# Patient Record
Sex: Female | Born: 1985 | Race: White | Hispanic: No | Marital: Single | Smoking: Never smoker
Health system: Southern US, Community
[De-identification: ages and names within clinical notes are randomized; demographics above are authoritative.]

## PROBLEM LIST (undated history)

## (undated) DIAGNOSIS — G113 Cerebellar ataxia with defective DNA repair: Secondary | ICD-10-CM

## (undated) DIAGNOSIS — K529 Noninfective gastroenteritis and colitis, unspecified: Secondary | ICD-10-CM

## (undated) DIAGNOSIS — R87619 Unspecified abnormal cytological findings in specimens from cervix uteri: Secondary | ICD-10-CM

## (undated) DIAGNOSIS — I82409 Acute embolism and thrombosis of unspecified deep veins of unspecified lower extremity: Secondary | ICD-10-CM

## (undated) DIAGNOSIS — L309 Dermatitis, unspecified: Secondary | ICD-10-CM

## (undated) DIAGNOSIS — Z87442 Personal history of urinary calculi: Secondary | ICD-10-CM

## (undated) DIAGNOSIS — Z148 Genetic carrier of other disease: Secondary | ICD-10-CM

## (undated) DIAGNOSIS — F32A Depression, unspecified: Secondary | ICD-10-CM

## (undated) DIAGNOSIS — F419 Anxiety disorder, unspecified: Secondary | ICD-10-CM

## (undated) HISTORY — DX: Genetic carrier of other disease: Z14.8

## (undated) HISTORY — DX: Unspecified abnormal cytological findings in specimens from cervix uteri: R87.619

## (undated) HISTORY — DX: Depression, unspecified: F32.A

## (undated) HISTORY — DX: Cerebellar ataxia with defective DNA repair: G11.3

## (undated) HISTORY — DX: Personal history of urinary calculi: Z87.442

## (undated) HISTORY — DX: Noninfective gastroenteritis and colitis, unspecified: K52.9

## (undated) HISTORY — PX: WISDOM TOOTH EXTRACTION: SHX21

## (undated) HISTORY — DX: Dermatitis, unspecified: L30.9

## (undated) HISTORY — DX: Acute embolism and thrombosis of unspecified deep veins of unspecified lower extremity: I82.409

## (undated) HISTORY — DX: Anxiety disorder, unspecified: F41.9

---

## 2010-07-18 HISTORY — PX: AUGMENTATION MAMMAPLASTY: SUR837

## 2010-07-18 HISTORY — PX: PLACEMENT OF BREAST IMPLANTS: SHX6334

## 2012-12-18 HISTORY — PX: CERVICAL BIOPSY  W/ LOOP ELECTRODE EXCISION: SUR135

## 2014-01-06 ENCOUNTER — Ambulatory Visit: Payer: Self-pay | Admitting: General Practice

## 2014-03-10 ENCOUNTER — Ambulatory Visit: Payer: Self-pay | Admitting: Urology

## 2014-03-19 ENCOUNTER — Emergency Department: Payer: Self-pay | Admitting: Emergency Medicine

## 2014-03-19 LAB — URINALYSIS, COMPLETE
BILIRUBIN, UR: NEGATIVE
BLOOD: NEGATIVE
Glucose,UR: NEGATIVE mg/dL (ref 0–75)
KETONE: NEGATIVE
Leukocyte Esterase: NEGATIVE
Nitrite: NEGATIVE
PROTEIN: NEGATIVE
Ph: 6 (ref 4.5–8.0)
Specific Gravity: 1.025 (ref 1.003–1.030)
WBC UR: <1 /HPF (ref 0–5)

## 2014-03-19 LAB — COMPREHENSIVE METABOLIC PANEL
ANION GAP: 9 (ref 7–16)
Albumin: 4 g/dL (ref 3.4–5.0)
Alkaline Phosphatase: 87 U/L
BILIRUBIN TOTAL: 0.8 mg/dL (ref 0.2–1.0)
BUN: 21 mg/dL — ABNORMAL HIGH (ref 7–18)
CALCIUM: 8.7 mg/dL (ref 8.5–10.1)
CO2: 24 mmol/L (ref 21–32)
Chloride: 101 mmol/L (ref 98–107)
Creatinine: 1.37 mg/dL — ABNORMAL HIGH (ref 0.60–1.30)
GFR CALC NON AF AMER: 53 — AB
GLUCOSE: 85 mg/dL (ref 65–99)
Osmolality: 270 (ref 275–301)
Potassium: 3.4 mmol/L — ABNORMAL LOW (ref 3.5–5.1)
SGOT(AST): 23 U/L (ref 15–37)
SGPT (ALT): 29 U/L (ref 12–78)
SODIUM: 134 mmol/L — AB (ref 136–145)
Total Protein: 8 g/dL (ref 6.4–8.2)

## 2014-03-19 LAB — CBC WITH DIFFERENTIAL/PLATELET
BASOS ABS: 0.1 10*3/uL (ref 0.0–0.1)
BASOS PCT: 0.5 %
EOS ABS: 0.1 10*3/uL (ref 0.0–0.7)
Eosinophil %: 0.5 %
HCT: 38.6 % (ref 35.0–47.0)
HGB: 13.1 g/dL (ref 12.0–16.0)
LYMPHS ABS: 2 10*3/uL (ref 1.0–3.6)
Lymphocyte %: 13.3 %
MCH: 30.3 pg (ref 26.0–34.0)
MCHC: 33.8 g/dL (ref 32.0–36.0)
MCV: 90 fL (ref 80–100)
Monocyte #: 1.1 x10 3/mm — ABNORMAL HIGH (ref 0.2–0.9)
Monocyte %: 7.8 %
Neutrophil #: 11.4 10*3/uL — ABNORMAL HIGH (ref 1.4–6.5)
Neutrophil %: 77.9 %
Platelet: 274 10*3/uL (ref 150–440)
RBC: 4.31 10*6/uL (ref 3.80–5.20)
RDW: 12.5 % (ref 11.5–14.5)
WBC: 14.7 10*3/uL — ABNORMAL HIGH (ref 3.6–11.0)

## 2014-03-22 ENCOUNTER — Emergency Department: Payer: Self-pay | Admitting: Emergency Medicine

## 2014-03-22 LAB — URINALYSIS, COMPLETE
BILIRUBIN, UR: NEGATIVE
Glucose,UR: NEGATIVE mg/dL (ref 0–75)
KETONE: NEGATIVE
Leukocyte Esterase: NEGATIVE
Nitrite: NEGATIVE
PH: 6 (ref 4.5–8.0)
Protein: NEGATIVE
RBC,UR: 26 /HPF (ref 0–5)
Specific Gravity: 1.028 (ref 1.003–1.030)

## 2014-03-22 LAB — CBC WITH DIFFERENTIAL/PLATELET
BASOS ABS: 0.1 10*3/uL (ref 0.0–0.1)
Basophil %: 0.5 %
EOS PCT: 0.7 %
Eosinophil #: 0.1 10*3/uL (ref 0.0–0.7)
HCT: 38.8 % (ref 35.0–47.0)
HGB: 13.3 g/dL (ref 12.0–16.0)
LYMPHS ABS: 1.6 10*3/uL (ref 1.0–3.6)
Lymphocyte %: 13.9 %
MCH: 30.7 pg (ref 26.0–34.0)
MCHC: 34.1 g/dL (ref 32.0–36.0)
MCV: 90 fL (ref 80–100)
Monocyte #: 0.9 x10 3/mm (ref 0.2–0.9)
Monocyte %: 7.6 %
NEUTROS PCT: 77.3 %
Neutrophil #: 9 10*3/uL — ABNORMAL HIGH (ref 1.4–6.5)
Platelet: 296 10*3/uL (ref 150–440)
RBC: 4.31 10*6/uL (ref 3.80–5.20)
RDW: 12.7 % (ref 11.5–14.5)
WBC: 11.6 10*3/uL — ABNORMAL HIGH (ref 3.6–11.0)

## 2014-03-22 LAB — BASIC METABOLIC PANEL
Anion Gap: 5 — ABNORMAL LOW (ref 7–16)
BUN: 17 mg/dL (ref 7–18)
CHLORIDE: 105 mmol/L (ref 98–107)
CREATININE: 1.12 mg/dL (ref 0.60–1.30)
Calcium, Total: 8.7 mg/dL (ref 8.5–10.1)
Co2: 29 mmol/L (ref 21–32)
EGFR (African American): >60
Glucose: 93 mg/dL (ref 65–99)
OSMOLALITY: 279 (ref 275–301)
Potassium: 3.4 mmol/L — ABNORMAL LOW (ref 3.5–5.1)
SODIUM: 139 mmol/L (ref 136–145)

## 2014-03-25 ENCOUNTER — Ambulatory Visit: Payer: Self-pay | Admitting: General Practice

## 2014-03-31 ENCOUNTER — Ambulatory Visit: Payer: Self-pay | Admitting: Urology

## 2014-03-31 LAB — PREGNANCY, URINE: Pregnancy Test, Urine: NEGATIVE m[IU]/mL

## 2014-04-02 ENCOUNTER — Ambulatory Visit: Payer: Self-pay | Admitting: Urology

## 2015-01-01 IMAGING — CT CT STONE STUDY
1 of 4 series · 3 of 46 positions shown, 7 images · non-contrast
Comparison: CT of the abdomen and pelvis performed 01/06/2014

CLINICAL DATA: Left flank pain.

EXAM:
CT ABDOMEN AND PELVIS WITHOUT CONTRAST
TECHNIQUE: Multidetector CT imaging of the abdomen and pelvis was performed
following the standard protocol without IV contrast.

[Series 4: lung · axial · 0.67mm/px · z∈[-124,-89]mm · 3 of 8 slices shown, 7 images]
[im 1/8  soft-tissue]
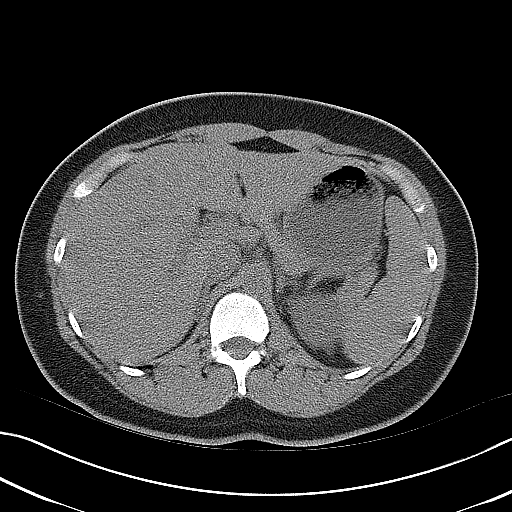
[im 1/8  lung]
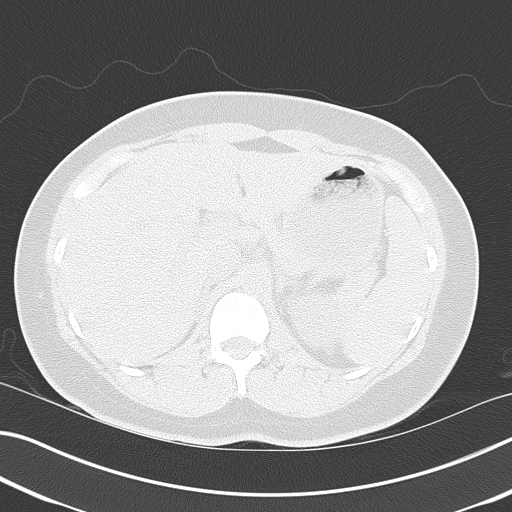
[im 1/8  bone]
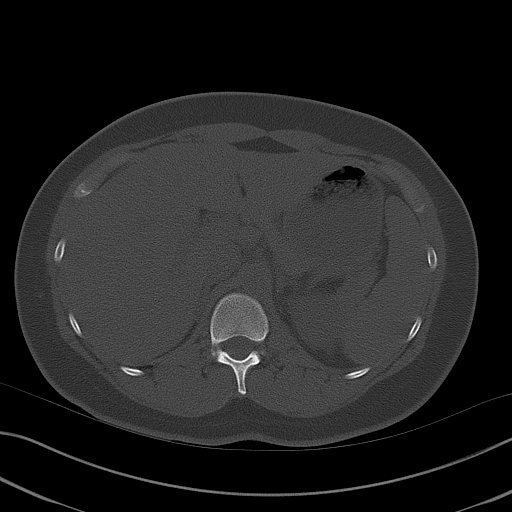
[im 4/8  soft-tissue]
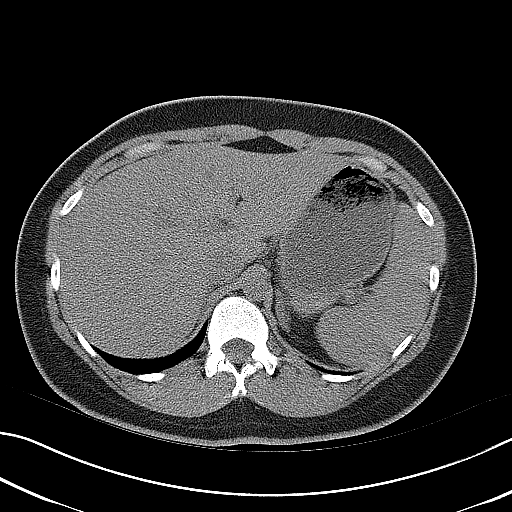
[im 4/8  lung]
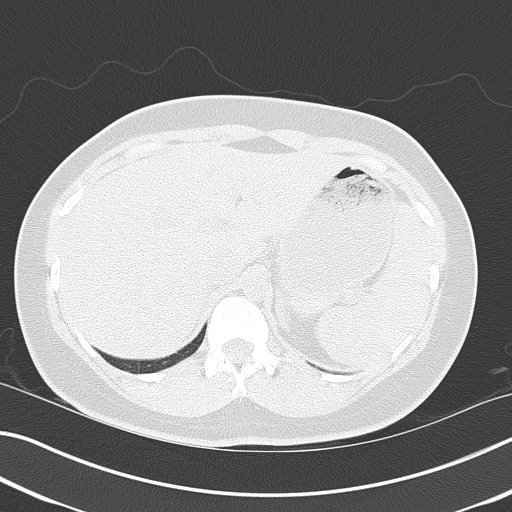
[im 8/8  soft-tissue]
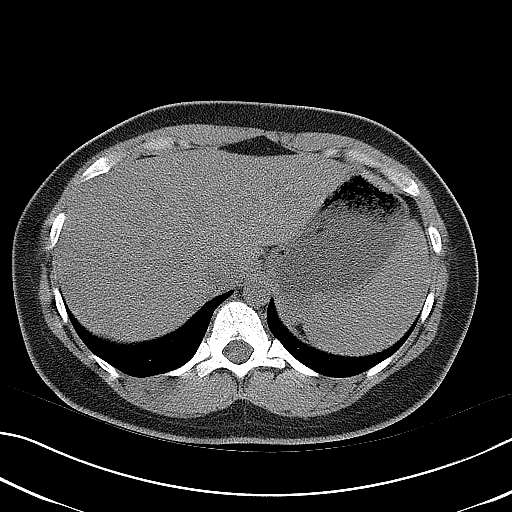
[im 8/8  lung]
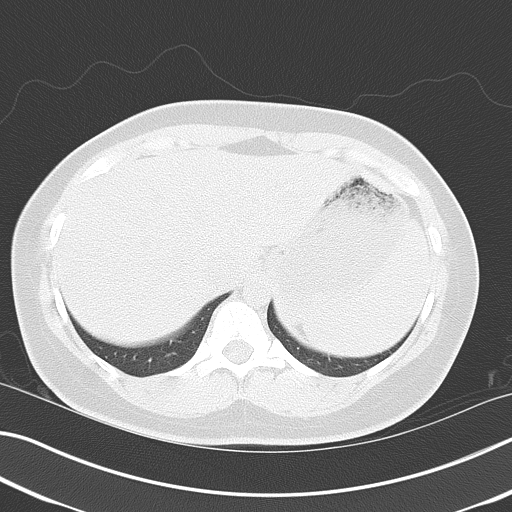

[3 of 46 positions shown; findings below may reference images not displayed]

FINDINGS: The minimally visualized lung bases are clear.

The visualized portions of the liver and spleen are unremarkable in
appearance. The gallbladder is within normal limits. The pancreas
and adrenal glands are unremarkable.

There is mild left-sided hydronephrosis, with left-sided perinephric
stranding and fluid, and an obstructing 4 mm stone seen distally at
the left vesicoureteral junction, along the base of the bladder.
Small bilateral nonobstructing renal stones are seen, measuring up
to 5 mm in size. The right kidney is otherwise unremarkable in
appearance.

No free fluid is identified. The small bowel is unremarkable in
appearance. The stomach is within normal limits. No acute vascular
abnormalities are seen.

The appendix is normal in caliber and contains air, without evidence
for appendicitis. The colon is unremarkable in appearance.

The bladder is largely decompressed and not well assessed. The
uterus is grossly unremarkable, with an intrauterine device noted in
expected position at the fundus of the uterus. The ovaries are
relatively symmetric; no suspicious adnexal masses are seen. No
inguinal lymphadenopathy is seen.

No acute osseous abnormalities are identified.
IMPRESSION: 1. Mild left-sided hydronephrosis, with an obstructing 4 mm stone
seen distally at the left vesicoureteral junction, along the base of
the bladder.
2. Small bilateral nonobstructing renal stones seen, measuring up to
5 mm in size.

## 2015-01-15 IMAGING — CR DG ABDOMEN 1V
1 series · 2 of 2 positions shown · non-contrast
Comparison: 03/25/2014, 03/19/2014

CLINICAL DATA: Left nephrolithiasis

EXAM:
ABDOMEN - 1 VIEW

[Series 1: ap · 0.17mm/px · 2 of 2 slices shown]
[im 1/2]
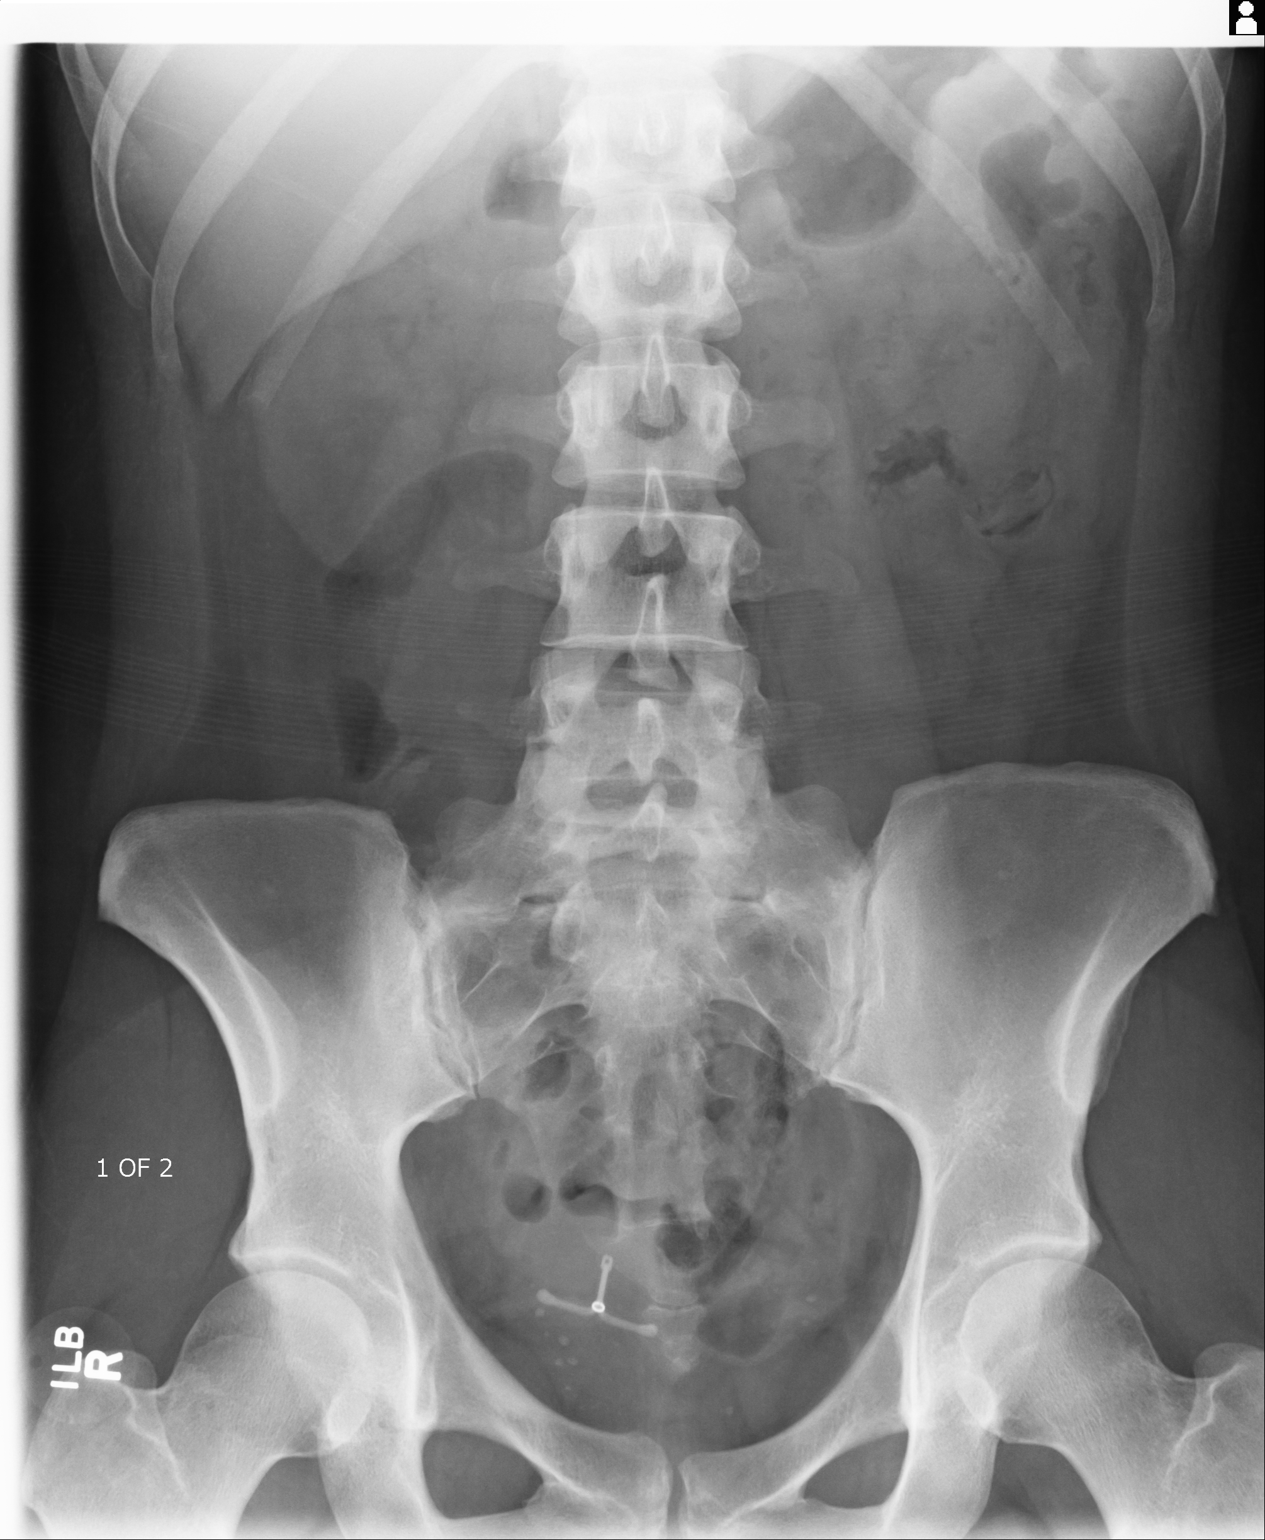
[im 2/2]
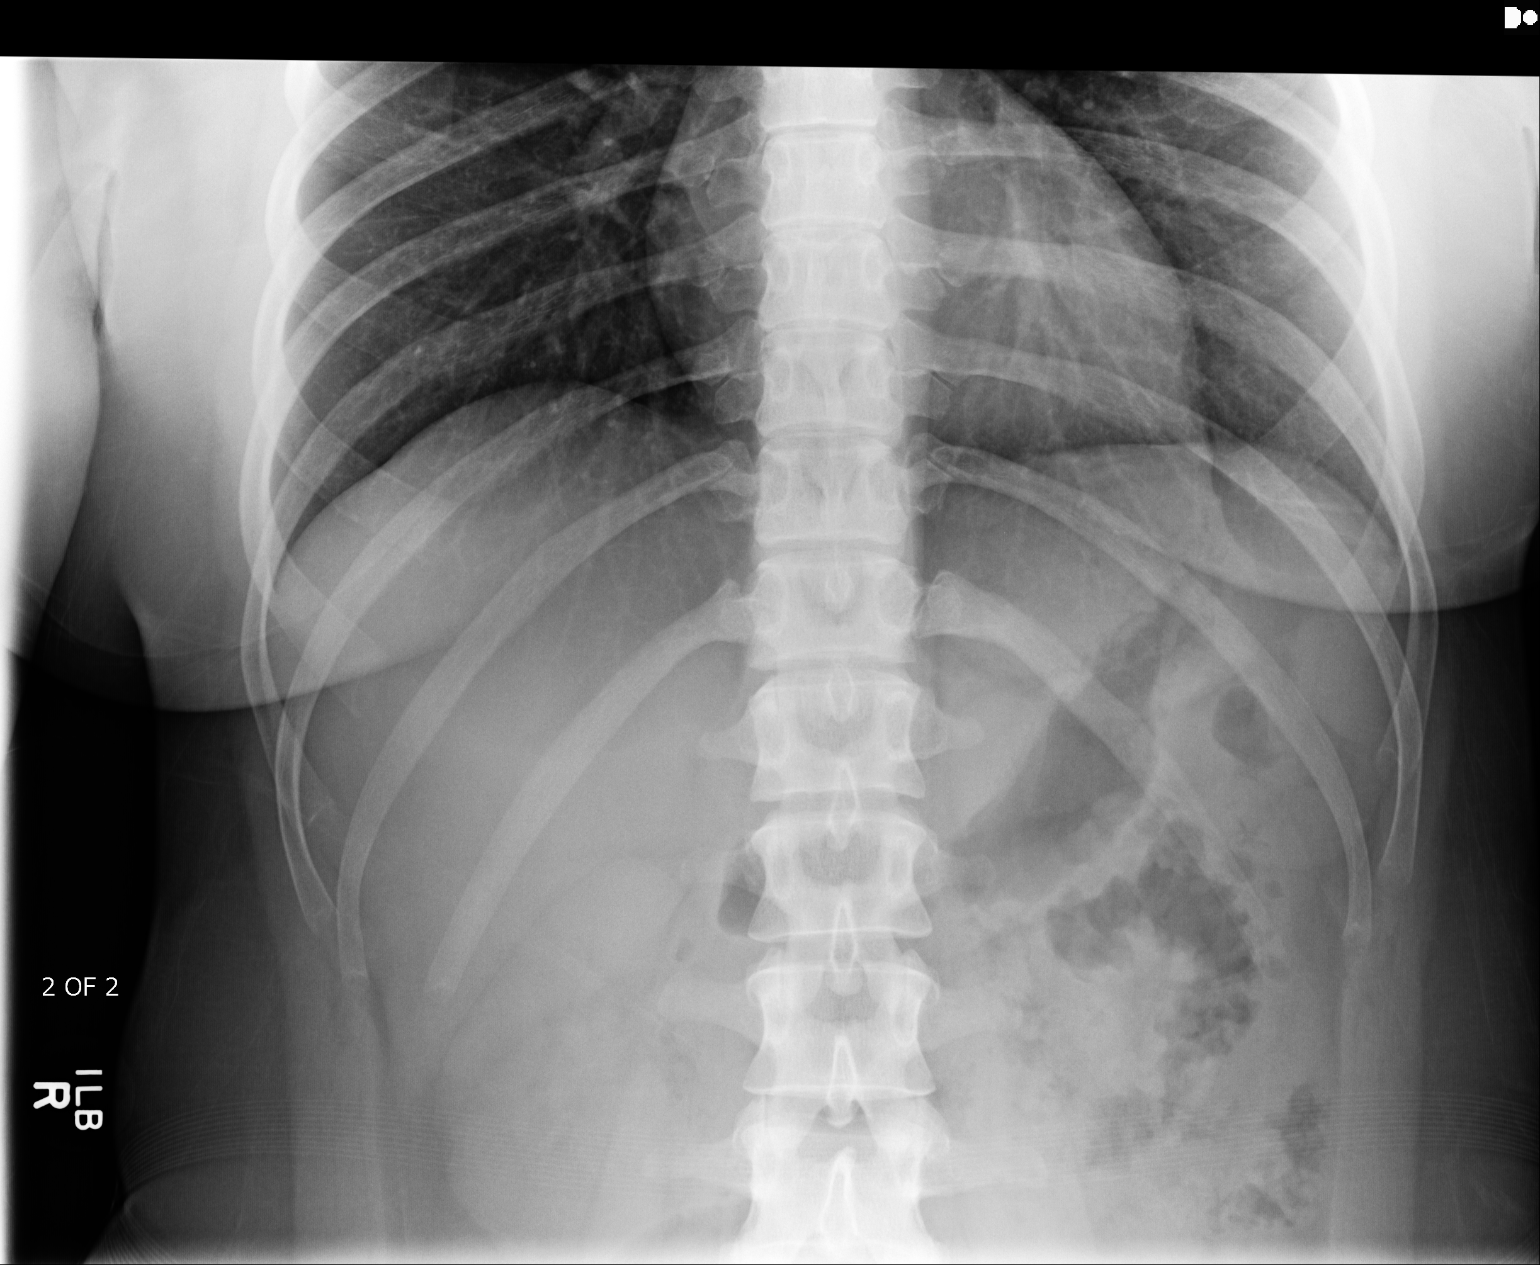

[2 of 2 positions shown; findings below may reference images not displayed]

FINDINGS: Stable punctate tiny pelvic calcifications. IUD noted within the
right central pelvis. Small punctate calcifications in the left
kidney lower pole as before. Normal bowel gas pattern without
obstruction. No osseous abnormality. Stable exam.
IMPRESSION: Stable left nephrolithiasis. Stable pelvic calcifications

## 2015-03-14 ENCOUNTER — Emergency Department: Payer: Self-pay | Admitting: Emergency Medicine

## 2015-03-14 LAB — URINALYSIS, COMPLETE
Bilirubin,UR: NEGATIVE
GLUCOSE, UR: NEGATIVE mg/dL (ref 0–75)
Ketone: NEGATIVE
LEUKOCYTE ESTERASE: NEGATIVE
NITRITE: NEGATIVE
PH: 5 (ref 4.5–8.0)
SPECIFIC GRAVITY: 1.026 (ref 1.003–1.030)
Squamous Epithelial: 1
WBC UR: 3 /HPF (ref 0–5)

## 2015-03-14 LAB — CBC
HCT: 40.4 % (ref 35.0–47.0)
HGB: 13.6 g/dL (ref 12.0–16.0)
MCH: 30.5 pg (ref 26.0–34.0)
MCHC: 33.6 g/dL (ref 32.0–36.0)
MCV: 91 fL (ref 80–100)
PLATELETS: 278 10*3/uL (ref 150–440)
RBC: 4.46 10*6/uL (ref 3.80–5.20)
RDW: 12.5 % (ref 11.5–14.5)
WBC: 9.6 10*3/uL (ref 3.6–11.0)

## 2015-03-14 LAB — COMPREHENSIVE METABOLIC PANEL
ALBUMIN: 4.2 g/dL
AST: 61 U/L — AB
Alkaline Phosphatase: 83 U/L
Anion Gap: 7 (ref 7–16)
BILIRUBIN TOTAL: 0.5 mg/dL
BUN: 16 mg/dL
CHLORIDE: 101 mmol/L
CREATININE: 0.91 mg/dL
Calcium, Total: 8.7 mg/dL — ABNORMAL LOW
Co2: 25 mmol/L
EGFR (Non-African Amer.): >60
GLUCOSE: 96 mg/dL
Potassium: 4.2 mmol/L
SGPT (ALT): 63 U/L — ABNORMAL HIGH
SODIUM: 133 mmol/L — AB
TOTAL PROTEIN: 7.5 g/dL

## 2021-03-24 ENCOUNTER — Telehealth: Payer: Self-pay | Admitting: Hematology and Oncology

## 2021-03-24 NOTE — Telephone Encounter (Signed)
Received a new referral from Dr. Gaetano Net for ATM gene mutation.  [name] returned my call and has been scheduled to see Dr. Chryl Heck on 4/14 at 10am. Pt aware to arrive 20 minutes early.

## 2021-03-28 ENCOUNTER — Encounter: Payer: Self-pay | Admitting: Gastroenterology

## 2021-03-31 ENCOUNTER — Encounter: Payer: Self-pay | Admitting: Hematology and Oncology

## 2021-03-31 ENCOUNTER — Other Ambulatory Visit: Payer: Self-pay

## 2021-03-31 ENCOUNTER — Inpatient Hospital Stay: Attending: Hematology and Oncology | Admitting: Hematology and Oncology

## 2021-03-31 DIAGNOSIS — Z1589 Genetic susceptibility to other disease: Secondary | ICD-10-CM | POA: Diagnosis not present

## 2021-03-31 DIAGNOSIS — Z1502 Genetic susceptibility to malignant neoplasm of ovary: Secondary | ICD-10-CM | POA: Insufficient documentation

## 2021-03-31 DIAGNOSIS — Z1509 Genetic susceptibility to other malignant neoplasm: Secondary | ICD-10-CM | POA: Diagnosis present

## 2021-03-31 DIAGNOSIS — Z1501 Genetic susceptibility to malignant neoplasm of breast: Secondary | ICD-10-CM

## 2021-03-31 NOTE — Assessment & Plan Note (Signed)
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  It is estimated that approximately 3 percent of White people in the [place] are ATM heterozygotes I discussed with the patient that having ATM gene mutation leads to 2-3 times increased risk of breast cancer which translates into a 30% lifetime risk of breast cancer. In two large cohort studies, it was thought that ER positive breast cancer is common than ER negative breast cancer among ATM carriers.  Intervention  For those with pathogenic variants in ATM, we typically initiate annual mammography with tomography and annual MRI, starting at age 71 years, given evidence of moderately increased lifetime risk of breast cancer.  The evidence is insufficient to uniformly recommend RRM, although for those with a concerning family history (>20 percent risk of breast cancer by a model), it may be reasonable for carriers to consider this option  Pathogenic variants in ATM do not appear to confer a significantly increased risk for ovarian cancer. For carriers who have a family history of ovarian cancer, we discuss the potential risks and benefits of rrBSO.  For ATM carriers with a family history of pancreatic cancer, pancreatic cancer screening is offered with annual MRCP or EUS.  PLAN Mammogram and MRI ordered by his gynecologist. SBE recommended monthly Discussed about life style modification including regular exercise, limiting alcohol intake and avoiding HRT in the future. She will RTC in 6 months She will send [place] a copy of the MRI report.

## 2021-03-31 NOTE — Progress Notes (Signed)
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  It is estimated that approximately 3 percent of White people in the [place] are ATM heterozygotes I discussed with the patient that having ATM gene mutation leads to 2-3 times increased risk of breast cancer which translates into a 30% lifetime risk of breast cancer. In two large cohort studies, it was thought that ER positive breast cancer is common than ER negative breast cancer among ATM carriers.  Intervention  For those with pathogenic variants in ATM, we typically initiate annual mammography with tomography and annual MRI, starting at age 39 years, given evidence of moderately increased lifetime risk of breast cancer.  The evidence is insufficient to uniformly recommend RRM, although for those with a concerning family history (>20 percent risk of breast cancer by a model), it may be reasonable for carriers to consider this option  Pathogenic variants in ATM do not appear to confer a significantly increased risk for ovarian cancer. For carriers who have a family history of ovarian cancer, we discuss the potential risks and benefits of rrBSO.  For ATM carriers with a family history of pancreatic cancer, pancreatic cancer screening is offered with annual MRCP or EUS.  PLAN Mammogram and MRI ordered by his gynecologist. SBE recommended monthly Discussed about life style modification including regular exercise, limiting alcohol intake and avoiding HRT in the future. She will RTC in 6 months She will send [place] a copy of the MRI report.  No orders of the  defined types were placed in this encounter.   HISTORY OF PRESENTING ILLNESS:   Amy Landry 35 y.o. female is here because of ATM mutation. [name] is here with her mother for initial visit. She is a healthy 35 yr old with no significant PMH. She has bilateral saline implants. She had genetic testing because her brother's daughter had ataxia telangiectasia and died of leukemia. Rest of the pertinent 10 point ROS reviewed and negative.  REVIEW OF SYSTEMS:   Constitutional: Denies fevers, chills or abnormal night sweats Eyes: Denies blurriness of vision, double vision or watery eyes Ears, nose, mouth, throat, and face: Denies mucositis or sore throat Respiratory: Denies cough, dyspnea or wheezes Cardiovascular: Denies palpitation, chest discomfort or lower extremity swelling Gastrointestinal:  Denies nausea, heartburn or change in bowel habits Skin: Denies abnormal skin rashes Lymphatics: Denies new lymphadenopathy or easy bruising Neurological:Denies numbness, tingling or new weaknesses Behavioral/Psych: Mood is stable, no new changes  All other systems were reviewed with the patient and are negative.  MEDICAL HISTORY:   No past medical history on file.  SURGICAL HISTORY:  Bilateral breast augmentation  SOCIAL HISTORY: Social History   Socioeconomic History  . Marital status: Divorced    Spouse name: Not on file  . Number of children: Not on file  . Years of education: Not on file  . Highest education level: Not on file  Occupational History  . Not on file  Tobacco Use  . Smoking status: Not on file  . Smokeless tobacco: Not on file  Substance and Sexual Activity  . Alcohol use: Not on file  . Drug use: Not on file  .  Sexual activity: Not on file  Other Topics Concern  . Not on file  Social History Narrative  . Not on file   Social Determinants of Health   Financial Resource Strain: Not on file  Food Insecurity: Not on file  Transportation Needs: Not on file   Physical Activity: Not on file  Stress: Not on file  Social Connections: Not on file  Intimate Partner Violence: Not on file    FAMILY HISTORY: No family history on file.  ALLERGIES:  is allergic to miconazole and sulfa antibiotics.  MEDICATIONS:  Current Outpatient Medications  Medication Sig Dispense Refill  . fluocinonide-emollient (LIDEX-E) 0.05 % cream     . Levonorgestrel (SKYLA) 13.5 MG IUD Skyla 14 mcg/24 hrs (3 yrs) 13.5 mg intrauterine device  Take 1 device by intrauterine route.    . norethindrone-ethinyl estradiol (JUNEL FE 1/20) 1-20 MG-MCG tablet     . traZODone (DESYREL) 50 MG tablet     . valACYclovir (VALTREX) 500 MG tablet     . fluticasone (FLONASE) 50 MCG/ACT nasal spray 1 spray by Both Nostrils route daily.    Marland Kitchen levocetirizine (XYZAL) 5 MG tablet Take by mouth.    . spironolactone (ALDACTONE) 50 MG tablet Take 50 mg by mouth 2 (two) times daily.    Marland Kitchen tretinoin (RETIN-A) 0.1 % cream Apply 1 application topically at bedtime.     No current facility-administered medications for this visit.   PHYSICAL EXAMINATION: ECOG PERFORMANCE STATUS: 0 - Asymptomatic  Vitals:   03/31/21 1009  BP: (!) 128/95  Pulse: 87  Resp: 16  Temp: 97.9 F (36.6 C)  SpO2: 99%   Filed Weights   03/31/21 1009  Weight: 161 lb 8 oz (73.3 kg)    GENERAL:alert, no distress and comfortable SKIN: skin color, texture, turgor are normal, no rashes or significant lesions EYES: normal, conjunctiva are pink and non-injected, sclera clear OROPHARYNX:no exudate, no erythema and lips, buccal mucosa, and tongue normal  NECK: supple, thyroid normal size, non-tender, without nodularity LYMPH:  no palpable lymphadenopathy in the cervical, axillary or inguinal LUNGS: clear to auscultation and percussion with normal breathing effort HEART: regular rate & rhythm and no murmurs and no lower extremity edema ABDOMEN:abdomen soft, non-tender and normal bowel sounds Musculoskeletal:no cyanosis of  digits and no clubbing  PSYCH: alert & oriented x 3 with fluent speech NEURO: no focal motor/sensory deficits Breast: Bilateral breasts examined. NO concerns for palpable lymphadenopathy.  LABORATORY DATA:  I have reviewed the data as listed Lab Results  Component Value Date   WBC 9.6 03/14/2015   HGB 13.6 03/14/2015   HCT 40.4 03/14/2015   MCV 91 03/14/2015   PLT 278 03/14/2015     Chemistry      Component Value Date/Time   NA 133 (L) 03/14/2015 1127   K 4.2 03/14/2015 1127   CL 101 03/14/2015 1127   CO2 25 03/14/2015 1127   BUN 16 03/14/2015 1127   CREATININE 0.91 03/14/2015 1127      Component Value Date/Time   CALCIUM 8.7 (L) 03/14/2015 1127   ALKPHOS 83 03/14/2015 1127   AST 61 (H) 03/14/2015 1127   ALT 63 (H) 03/14/2015 1127   BILITOT 0.5 03/14/2015 1127      RADIOGRAPHIC STUDIES: I have personally reviewed the radiological images as listed and agreed with the findings in the report. No results found.  All questions were answered. The patient knows to call the clinic with any problems, questions or concerns. I spent 45 minutes  in the care of this patient including H and P, review of records, counseling and coordination of care.     Benay Pike, MD 03/31/2021 12:48 PM

## 2021-04-01 ENCOUNTER — Telehealth: Payer: Self-pay | Admitting: Hematology and Oncology

## 2021-04-01 NOTE — Telephone Encounter (Signed)
Scheduled per 04/14 los, patient has been called and voicemail was left.

## 2021-05-10 ENCOUNTER — Ambulatory Visit (INDEPENDENT_AMBULATORY_CARE_PROVIDER_SITE_OTHER): Admitting: Gastroenterology

## 2021-05-10 ENCOUNTER — Encounter: Payer: Self-pay | Admitting: Gastroenterology

## 2021-05-10 VITALS — BP 110/80 | HR 80 | Ht 65.0 in | Wt 166.5 lb

## 2021-05-10 DIAGNOSIS — Z800 Family history of malignant neoplasm of digestive organs: Secondary | ICD-10-CM | POA: Diagnosis not present

## 2021-05-10 DIAGNOSIS — Z1501 Genetic susceptibility to malignant neoplasm of breast: Secondary | ICD-10-CM | POA: Diagnosis not present

## 2021-05-10 DIAGNOSIS — Z1589 Genetic susceptibility to other disease: Secondary | ICD-10-CM | POA: Diagnosis not present

## 2021-05-10 DIAGNOSIS — Z1509 Genetic susceptibility to other malignant neoplasm: Secondary | ICD-10-CM | POA: Diagnosis not present

## 2021-05-10 NOTE — Progress Notes (Signed)
HPI :  35 year old female heterozygous for ATM gene mutation, history of renal stones, history of abnormal Pap smear, referred by Everlene Farrier MD to discuss ATM gene mutation and recommendations for malignancy screening.  The patient states she is offered genetic testing annually as part of her gynecologic exam.  She decide to proceed with testing this year and was positive for the ATM mutation.  She has 2 grandmothers and a paternal aunt with breast cancer.  She has a maternal uncle who had colon cancer at age 19 or younger.  Her father had esophageal cancer at age 54s.  She is generally healthy without any significant medical problems, most problematic issue she has appears to be renal stones.  She denies any family history of pancreatic cancer.  She has never had pancreatitis.  She has no problems with her bowels.  No blood in her stools.  She has some symptomatic hemorrhoids at times but otherwise feels well.  Her mother has Crohn's disease.  She had labs in October showing a hemoglobin of 13.7.  The patient works as a Statistician.  She has had a few CT scans over the past few years for her renal stones.  She has had a CT scan in April 2015, March 2016, August 2020 which all showed a normal-appearing pancreas.    Past Medical History:  Diagnosis Date  . Abnormal Pap smear of cervix    LEEP   . 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)   . Carrier of genetic disorder    high risk cancer mutation gene - high risk for breast and pancreatic cancer  . Depression   . History of kidney stones      Past Surgical History:  Procedure Laterality Date  . CERVICAL BIOPSY  W/ LOOP ELECTRODE EXCISION  2014  . PLACEMENT OF BREAST IMPLANTS  07/2010  . WISDOM TOOTH EXTRACTION     Family History  Problem Relation Age of Onset  . Hypertension Mother   . Crohn's disease Mother   . Hypercholesterolemia Father   . Hypertension Father   . Heart disease Father   . Diabetes Father   .  Melanoma Father   . Esophageal cancer Father   . Uterine cancer Maternal Grandmother   . Breast cancer Maternal Grandmother   . Lung cancer Maternal Grandmother   . Heart disease Maternal Grandfather   . Breast cancer Paternal Grandmother   . Osteoporosis Paternal Grandmother   . CVA Paternal Grandfather   . Diabetes Paternal Grandfather   . Dementia Paternal Grandfather   . Colon cancer Maternal Uncle   . Breast cancer Paternal Aunt    Social History   Tobacco Use  . Smoking status: Never Smoker  . Smokeless tobacco: Never Used  Vaping Use  . Vaping Use: Never used  Substance Use Topics  . Alcohol use: Yes    Comment: 1 per week  . Drug use: Never   Current Outpatient Medications  Medication Sig Dispense Refill  . fluocinonide-emollient (LIDEX-E) 0.05 % cream Apply 1 application topically as needed.    . fluticasone (FLONASE) 50 MCG/ACT nasal spray 1 spray by Both Nostrils route daily.    Marland Kitchen levocetirizine (XYZAL) 5 MG tablet Take 2.5 mg by mouth daily.    . Levonorgestrel (SKYLA) 13.5 MG IUD Skyla 14 mcg/24 hrs (3 yrs) 13.5 mg intrauterine device  Take 1 device by intrauterine route.    . norethindrone-ethinyl estradiol-FE (LOESTRIN FE) 1-20 MG-MCG tablet Take 1 tablet by mouth daily.    Marland Kitchen  spironolactone (ALDACTONE) 50 MG tablet Take 50 mg by mouth 2 (two) times daily.    . traZODone (DESYREL) 50 MG tablet Take 50 mg by mouth at bedtime as needed.    . tretinoin (RETIN-A) 0.1 % cream Apply 1 application topically at bedtime.    . valACYclovir (VALTREX) 500 MG tablet Take 500 mg by mouth as needed.     No current facility-administered medications for this visit.   Allergies  Allergen Reactions  . Miconazole Rash    Labial swelling and burning  . Sulfa Antibiotics Nausea And Vomiting     Review of Systems: All systems reviewed and negative except where noted in HPI.   Labs reviewed in care everywhere.  Physical Exam: BP 110/80 (BP Location: Left Arm, Patient  Position: Sitting, Cuff Size: Normal)   Pulse 80   Ht 5' 5"  (1.651 m) Comment: height measured without shoes  Wt 166 lb 8 oz (75.5 kg)   BMI 27.71 kg/m  Constitutional: Pleasant,well-developed, female in no acute distress. HEENT: Normocephalic and atraumatic. Conjunctivae are normal. No scleral icterus. Cardiovascular: Normal rate, regular rhythm.  Pulmonary/chest: Effort normal and breath sounds normal. Abdominal: Soft, nondistended, nontender.  There are no masses palpable.. Extremities: no edema Lymphadenopathy: No cervical adenopathy noted. Neurological: Alert and oriented to person place and time. Skin: Skin is warm and dry. No rashes noted. Psychiatric: Normal mood and affect. Behavior is normal.   ASSESSMENT AND PLAN: 35 year old female here for new patient assessment the following:  ATM Gene Mutation  Patient was referred here to discuss if she needed screening for pancreatic cancer or colon cancer.  I reviewed the literature on ATM gene mutation with her.  This can increase her risk for pancreatic cancer a few percentage points above the general population, and potentially slightly increase her risk for colon cancer.  That being said, there are no guidelines which recommend pancreatic cancer screening or colon cancer screening at this point in her life.  If she has a strong family history of pancreatic cancer, usually MRCP is offered starting around age 59.  She does not have a family history of pancreatic cancer although she is anxious about this finding and does want to proceed with imaging of her pancreas at some point time.  I do not think she needs to do that now, but can reconsider it at age 79 or if she develops symptoms in the interim.  Regarding colon cancer screening in the absence of symptoms, she would start at age 68 at the same time as the general population.  If she is anxious about doing these tests sooner I can order them for her for peace of mind however do not think  insurance would cover it in that situation without symptoms, if purely based on this genetic mutation.  She agreed and verbalized understanding.  I explained to her that genetics is an evolving field and these are the recommendations with the information that we have at this time but information on this topic could change and she should follow-up periodically for this.  She is plugged in with oncology and has plans to have a mammogram and see them routinely.  [place] Cellar, MD [facility]  CC: Everlene Farrier, MD

## 2021-09-27 ENCOUNTER — Other Ambulatory Visit: Payer: Self-pay

## 2021-09-27 ENCOUNTER — Inpatient Hospital Stay: Attending: Hematology and Oncology | Admitting: Hematology and Oncology

## 2021-09-27 DIAGNOSIS — Z1502 Genetic susceptibility to malignant neoplasm of ovary: Secondary | ICD-10-CM | POA: Diagnosis present

## 2021-09-27 DIAGNOSIS — Z1501 Genetic susceptibility to malignant neoplasm of breast: Secondary | ICD-10-CM | POA: Diagnosis not present

## 2021-09-27 DIAGNOSIS — Z1509 Genetic susceptibility to other malignant neoplasm: Secondary | ICD-10-CM | POA: Diagnosis not present

## 2021-09-27 DIAGNOSIS — Z1589 Genetic susceptibility to other disease: Secondary | ICD-10-CM

## 2021-09-27 DIAGNOSIS — Z79818 Long term (current) use of other agents affecting estrogen receptors and estrogen levels: Secondary | ICD-10-CM | POA: Diagnosis not present

## 2021-09-27 NOTE — Assessment & Plan Note (Addendum)
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  It is estimated that approximately 3 percent of White people in the [place] are ATM heterozygotes I discussed with the patient that having ATM gene mutation leads to 2-3 times increased risk of breast cancer which translates into a 30% lifetime risk of breast cancer. In two large cohort studies, it was thought that ER positive breast cancer is common than ER negative breast cancer among ATM carriers.  Intervention  For those with pathogenic variants in ATM, we typically initiate annual mammography with tomography and annual MRI, starting at age 39 years, given evidence of moderately increased lifetime risk of breast cancer. We will order a baseline MRI now and will consider annual MRI's starting at the age of 55. The evidence is insufficient to uniformly recommend RRM at this time. Pathogenic variants in ATM do not appear to confer a significantly increased risk for ovarian cancer. For carriers who have a family history of ovarian cancer, we discuss the potential risks and benefits of rrBSO.  For ATM carriers with a family history of pancreatic cancer, pancreatic cancer screening is offered with annual MRCP or EUS.  PLAN Mammogram in April 2022, MRI ordered for Oct 2022 SBE recommended monthly Discussed about life style modification including regular exercise, limiting alcohol intake and avoiding HRT in the future.  She is taking some estrogen supplementation for acne, recommended to consider non estrogen options for this. She will RTC in 6 months t

## 2021-09-27 NOTE — Progress Notes (Signed)
[facility] CONSULT NOTE  Patient Care Team: Katherina Mires, MD as PCP - General (Family Medicine)  CHIEF COMPLAINTS/PURPOSE OF CONSULTATION:  ATM heterozygous mutation  ASSESSMENT & PLAN:   Monoallelic mutation of ATM gene This is a very pleasant 35 yr old female patient with PMH significant for breast augmentation with saline implantation referred to high risk breast clinic for recommendations regarding her ATM heterozygous gene mutation.  It is estimated that approximately 3 percent of White people in the [place] are ATM heterozygotes I discussed with the patient that having ATM gene mutation leads to 2-3 times increased risk of breast cancer which translates into a 30% lifetime risk of breast cancer. In two large cohort studies, it was thought that ER positive breast cancer is common than ER negative breast cancer among ATM carriers.  Intervention  For those with pathogenic variants in ATM, we typically initiate annual mammography with tomography and annual MRI, starting at age 53 years, given evidence of moderately increased lifetime risk of breast cancer. We will order a baseline MRI now and will consider annual MRI's starting at the age of 53. The evidence is insufficient to uniformly recommend RRM at this time. Pathogenic variants in ATM do not appear to confer a significantly increased risk for ovarian cancer. For carriers who have a family history of ovarian cancer, we discuss the potential risks and benefits of rrBSO.  For ATM carriers with a family history of pancreatic cancer, pancreatic cancer screening is offered with annual MRCP or EUS.  PLAN Mammogram in April 2022, MRI ordered for Oct 2022 SBE recommended monthly Discussed about life style modification including regular exercise, limiting alcohol intake and avoiding HRT in the future.  She is taking some estrogen supplementation for acne, recommended to consider non estrogen options for this. She  will RTC in 6 months t  Orders Placed This Encounter  Procedures   MR BREAST BILATERAL W WO CONTRAST INC CAD    Standing Status:   Future    Standing Expiration Date:   09/27/2022    Order Specific Question:   If indicated for the ordered procedure, I authorize the administration of contrast media per Radiology protocol    Answer:   Yes    Order Specific Question:   What is the patient's sedation requirement?    Answer:   No Sedation    Order Specific Question:   Does the patient have a pacemaker or implanted devices?    Answer:   No    Order Specific Question:   Preferred imaging location?    Answer:   [facility] (table limit - 550 lbs)     HISTORY OF PRESENTING ILLNESS:   Amy Landry 35 y.o. female is here because of ATM mutation. Ms Carp is here with her mother for initial visit.  She is a healthy 35 yr old with no significant PMH. She has bilateral saline implants. She had genetic testing because her brother's daughter had ataxia telangiectasia and died of leukemia. She is here for follow up. Since last visit, she had a mammogram in April, which was BIRADS 2. She didn't report any self breast changes. She was wondering if she should consider bilateral mastectomy. Rest of the pertinent 10 point ROS reviewed and negative.  REVIEW OF SYSTEMS:   Constitutional: Denies fevers, chills or abnormal night sweats Eyes: Denies blurriness of vision, double vision or watery eyes Ears, nose, mouth, throat, and face: Denies mucositis or sore throat Respiratory: Denies  cough, dyspnea or wheezes Cardiovascular: Denies palpitation, chest discomfort or lower extremity swelling Gastrointestinal:  Denies nausea, heartburn or change in bowel habits Skin: Denies abnormal skin rashes Lymphatics: Denies new lymphadenopathy or easy bruising Neurological:Denies numbness, tingling or new weaknesses Behavioral/Psych: Mood is stable, no new changes  All other systems were reviewed with the  patient and are negative.  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[place])    Carrier of genetic disorder    high risk cancer mutation gene - high risk for breast and pancreatic cancer   Depression    History of kidney stones     SURGICAL HISTORY:  Bilateral breast augmentation  SOCIAL HISTORY: Social History   Socioeconomic History   Marital status: Divorced    Spouse name: Not on file   Number of children: 0   Years of education: Not on file   Highest education level: Not on file  Occupational History   Occupation: Respiratory therapist  Tobacco Use   Smoking status: Never   Smokeless tobacco: Never  Vaping Use   Vaping Use: Never used  Substance and Sexual Activity   Alcohol use: Yes    Comment: 1 per week   Drug use: Never   Sexual activity: Not on file  Other Topics Concern   Not on file  Social History Narrative   Not on file   Social Determinants of Health   Financial Resource Strain: Not on file  Food Insecurity: Not on file  Transportation Needs: Not on file  Physical Activity: Not on file  Stress: Not on file  Social Connections: Not on file  Intimate Partner Violence: Not on file    FAMILY HISTORY: Family History  Problem Relation Age of Onset   Hypertension Mother    Crohn's disease Mother    Hypercholesterolemia Father    Hypertension Father    Heart disease Father    Diabetes Father    Melanoma Father    Esophageal cancer Father    Uterine cancer Maternal Grandmother    Breast cancer Maternal Grandmother    Lung cancer Maternal Grandmother    Heart disease Maternal Grandfather    Breast cancer Paternal Grandmother    Osteoporosis Paternal Grandmother    CVA Paternal Grandfather    Diabetes Paternal Grandfather    Dementia Paternal Grandfather    Colon cancer Maternal Uncle    Breast cancer Paternal Aunt     ALLERGIES:  is allergic to  miconazole and sulfa antibiotics.  MEDICATIONS:  Current Outpatient Medications  Medication Sig Dispense Refill   fluocinonide-emollient (LIDEX-E) 0.05 % cream Apply 1 application topically as needed.     fluticasone (FLONASE) 50 MCG/ACT nasal spray 1 spray by Both Nostrils route daily.     levocetirizine (XYZAL) 5 MG tablet Take 2.5 mg by mouth daily.     Levonorgestrel (SKYLA) 13.5 MG IUD Skyla 14 mcg/24 hrs (3 yrs) 13.5 mg intrauterine device  Take 1 device by intrauterine route.     norethindrone-ethinyl estradiol-FE (LOESTRIN FE) 1-20 MG-MCG tablet Take 1 tablet by mouth daily.     spironolactone (ALDACTONE) 50 MG tablet Take 50 mg by mouth 2 (two) times daily.     traZODone (DESYREL) 50 MG tablet Take 50 mg by mouth at bedtime as needed.     tretinoin (RETIN-A) 0.1 % cream Apply 1 application topically at bedtime.  valACYclovir (VALTREX) 500 MG tablet Take 500 mg by mouth as needed.     No current facility-administered medications for this visit.   PHYSICAL EXAMINATION: ECOG PERFORMANCE STATUS: 0 - Asymptomatic  Vitals:   09/27/21 0921  BP: 132/87  Pulse: 95  Resp: 18  Temp: 99 F (37.2 C)  SpO2: 100%    Filed Weights   09/27/21 0921  Weight: 163 lb 1.6 oz (74 kg)     GENERAL:alert, no distress and comfortable Breast: Bilateral breasts examined. NO concerns for palpable lymphadenopathy. Implants appear to be intact on exam. Lower extremities: no BLE edema.  LABORATORY DATA:  I have reviewed the data as listed Lab Results  Component Value Date   WBC 9.6 03/14/2015   HGB 13.6 03/14/2015   HCT 40.4 03/14/2015   MCV 91 03/14/2015   PLT 278 03/14/2015     Chemistry      Component Value Date/Time   NA 133 (L) 03/14/2015 1127   K 4.2 03/14/2015 1127   CL 101 03/14/2015 1127   CO2 25 03/14/2015 1127   BUN 16 03/14/2015 1127   CREATININE 0.91 03/14/2015 1127      Component Value Date/Time   CALCIUM 8.7 (L) 03/14/2015 1127   ALKPHOS 83 03/14/2015 1127    AST 61 (H) 03/14/2015 1127   ALT 63 (H) 03/14/2015 1127   BILITOT 0.5 03/14/2015 1127     Mammogram 03/2021  IMPRESSION: There is no radiographic evidence malignancy on the right.   RECOMMENDATION: Bilateral screening mammogram at age 73, unless otherwise clinically indicated.   ACR BI-RADS CATEGORY 2 - Benign.  Letter Sent: Benign Exam.   The patient was informed of these results and recommendations at the time of the visit.   Electronically Signed by: Charlett Lango on 04/11/2021 3:28 PM  RADIOGRAPHIC STUDIES: I have personally reviewed the radiological images as listed and agreed with the findings in the report. No results found.  All questions were answered. The patient knows to call the clinic with any problems, questions or concerns. I spent 20 minutes in the care of this patient including H and P, review of records, counseling and coordination of care.    Benay Pike, MD 09/27/2021 4:38 PM

## 2021-09-29 ENCOUNTER — Ambulatory Visit: Admitting: Hematology and Oncology

## 2021-10-18 ENCOUNTER — Other Ambulatory Visit: Payer: Self-pay

## 2021-10-18 ENCOUNTER — Ambulatory Visit (HOSPITAL_COMMUNITY)
Admission: RE | Admit: 2021-10-18 | Discharge: 2021-10-18 | Disposition: A | Discharge status: Home / Self Care | Source: Ambulatory Visit | Attending: Hematology and Oncology | Admitting: Hematology and Oncology

## 2021-10-18 DIAGNOSIS — Z1501 Genetic susceptibility to malignant neoplasm of breast: Secondary | ICD-10-CM | POA: Diagnosis present

## 2021-10-18 DIAGNOSIS — Z1509 Genetic susceptibility to other malignant neoplasm: Secondary | ICD-10-CM | POA: Insufficient documentation

## 2021-10-18 DIAGNOSIS — Z1589 Genetic susceptibility to other disease: Secondary | ICD-10-CM | POA: Diagnosis present

## 2021-10-18 MED ORDER — GADOBUTROL 1 MMOL/ML IV SOLN
7.0000 mL | Freq: Once | INTRAVENOUS | Status: AC | PRN
  Administered 2021-10-18: 7 mL via INTRAVENOUS

## 2021-10-19 ENCOUNTER — Encounter: Payer: Self-pay | Admitting: Hematology and Oncology

## 2021-10-20 ENCOUNTER — Other Ambulatory Visit: Payer: Self-pay | Admitting: Hematology and Oncology

## 2021-10-20 DIAGNOSIS — R9389 Abnormal findings on diagnostic imaging of other specified body structures: Secondary | ICD-10-CM

## 2021-10-25 ENCOUNTER — Encounter: Payer: Self-pay | Admitting: Hematology and Oncology

## 2021-10-25 ENCOUNTER — Telehealth: Payer: Self-pay | Admitting: Nurse Practitioner

## 2021-10-25 ENCOUNTER — Other Ambulatory Visit: Payer: Self-pay | Admitting: Hematology

## 2021-10-25 MED ORDER — LORAZEPAM 0.5 MG PO TABS: 0.5000 mg | ORAL_TABLET | Freq: Once | ORAL | 0 refills | Status: AC

## 2021-10-25 MED ORDER — ALPRAZOLAM 0.5 MG PO TABS: 0.5000 mg | ORAL_TABLET | Freq: Once | ORAL | 0 refills | Status: AC

## 2021-10-25 NOTE — Telephone Encounter (Signed)
I called pt and reviewed her breast MRI. Bilateral MR-guided biopsies have been recommended. She is agreeable, scheduled on 10/31/21. She is anxious, requesting medication for the procedure. Med list reviewed, I will give xanax x1 to take 30-60 minutes prior. Her significant other will drive her. I will let her know results once available. She appreciates the call.   Cira Rue, NP

## 2021-10-31 ENCOUNTER — Ambulatory Visit
Admission: RE | Admit: 2021-10-31 | Discharge: 2021-10-31 | Disposition: A | Discharge status: Home / Self Care | Source: Ambulatory Visit | Attending: Hematology and Oncology | Admitting: Hematology and Oncology

## 2021-10-31 ENCOUNTER — Other Ambulatory Visit: Payer: Self-pay | Admitting: Family Medicine

## 2021-10-31 ENCOUNTER — Ambulatory Visit: Admission: RE | Admit: 2021-10-31 | Source: Ambulatory Visit

## 2021-10-31 ENCOUNTER — Other Ambulatory Visit: Payer: Self-pay | Admitting: Hematology and Oncology

## 2021-10-31 ENCOUNTER — Ambulatory Visit

## 2021-10-31 ENCOUNTER — Other Ambulatory Visit: Payer: Self-pay

## 2021-10-31 DIAGNOSIS — R9389 Abnormal findings on diagnostic imaging of other specified body structures: Secondary | ICD-10-CM

## 2021-10-31 DIAGNOSIS — N6320 Unspecified lump in the left breast, unspecified quadrant: Secondary | ICD-10-CM

## 2021-10-31 MED ORDER — GADOBUTROL 1 MMOL/ML IV SOLN
7.0000 mL | Freq: Once | INTRAVENOUS | Status: AC | PRN
  Administered 2021-10-31: 7 mL via INTRAVENOUS

## 2021-11-23 ENCOUNTER — Ambulatory Visit
Admission: RE | Admit: 2021-11-23 | Discharge: 2021-11-23 | Disposition: A | Discharge status: Home / Self Care | Source: Ambulatory Visit | Attending: Family Medicine | Admitting: Family Medicine

## 2021-11-23 DIAGNOSIS — N6320 Unspecified lump in the left breast, unspecified quadrant: Secondary | ICD-10-CM

## 2021-11-26 ENCOUNTER — Other Ambulatory Visit: Payer: Self-pay | Admitting: Hematology and Oncology

## 2021-11-26 DIAGNOSIS — Z1501 Genetic susceptibility to malignant neoplasm of breast: Secondary | ICD-10-CM

## 2021-11-26 DIAGNOSIS — Z1509 Genetic susceptibility to other malignant neoplasm: Secondary | ICD-10-CM

## 2021-12-02 ENCOUNTER — Telehealth: Payer: Self-pay | Admitting: Hematology and Oncology

## 2021-12-02 NOTE — Telephone Encounter (Signed)
Rescheduled appointment per 12/15 scheduling message. Patient is aware.

## 2022-03-28 ENCOUNTER — Ambulatory Visit: Admitting: Hematology and Oncology

## 2022-04-17 ENCOUNTER — Encounter: Payer: Self-pay | Admitting: Hematology and Oncology

## 2022-04-28 ENCOUNTER — Ambulatory Visit (HOSPITAL_COMMUNITY)
Admission: RE | Admit: 2022-04-28 | Discharge: 2022-04-28 | Disposition: A | Discharge status: Home / Self Care | Source: Ambulatory Visit | Attending: Hematology and Oncology | Admitting: Hematology and Oncology

## 2022-04-28 DIAGNOSIS — Z1509 Genetic susceptibility to other malignant neoplasm: Secondary | ICD-10-CM | POA: Insufficient documentation

## 2022-04-28 DIAGNOSIS — Z1501 Genetic susceptibility to malignant neoplasm of breast: Secondary | ICD-10-CM | POA: Diagnosis present

## 2022-04-28 DIAGNOSIS — Z1589 Genetic susceptibility to other disease: Secondary | ICD-10-CM | POA: Diagnosis present

## 2022-04-28 MED ORDER — GADOBUTROL 1 MMOL/ML IV SOLN
7.0000 mL | Freq: Once | INTRAVENOUS | Status: AC | PRN
  Administered 2022-04-28: 7 mL via INTRAVENOUS

## 2022-05-04 ENCOUNTER — Ambulatory Visit: Admitting: Hematology and Oncology

## 2022-05-05 ENCOUNTER — Telehealth: Payer: Self-pay | Admitting: Hematology and Oncology

## 2022-05-05 NOTE — Telephone Encounter (Signed)
Rescheduled appointment per providers template. Patient aware.

## 2022-05-08 ENCOUNTER — Inpatient Hospital Stay: Admitting: Hematology and Oncology

## 2022-05-16 ENCOUNTER — Encounter: Payer: Self-pay | Admitting: Hematology and Oncology

## 2022-05-16 ENCOUNTER — Inpatient Hospital Stay: Attending: Hematology and Oncology | Admitting: Hematology and Oncology

## 2022-05-16 ENCOUNTER — Other Ambulatory Visit: Payer: Self-pay

## 2022-05-16 VITALS — BP 128/87 | HR 77 | Temp 97.7°F | Resp 16 | Ht 65.0 in | Wt 162.0 lb

## 2022-05-16 DIAGNOSIS — Z1501 Genetic susceptibility to malignant neoplasm of breast: Secondary | ICD-10-CM | POA: Insufficient documentation

## 2022-05-16 DIAGNOSIS — Z1509 Genetic susceptibility to other malignant neoplasm: Secondary | ICD-10-CM | POA: Insufficient documentation

## 2022-05-16 DIAGNOSIS — Z1589 Genetic susceptibility to other disease: Secondary | ICD-10-CM | POA: Insufficient documentation

## 2022-05-16 NOTE — Progress Notes (Signed)
[facility] NOTE  Patient Care Team: [name], NP as PCP - General (Adult Health Nurse Practitioner)  CHIEF COMPLAINTS/PURPOSE OF CONSULTATION:  ATM heterozygous mutation  ASSESSMENT & PLAN:   No problem-specific Assessment & Plan notes found for this encounter.   Orders Placed This Encounter  Procedures   MM DIGITAL SCREENING BILATERAL    Standing Status:   Future    Standing Expiration Date:   05/17/2023    Order Specific Question:   Reason for Exam (SYMPTOM  OR DIAGNOSIS REQUIRED)    Answer:   ATM heterozygous    Order Specific Question:   Is the patient pregnant?    Answer:   No    Order Specific Question:   Preferred imaging location?    [facility]     HISTORY OF PRESENTING ILLNESS:   Amy Landry 36 y.o. female is here because of ATM mutation. [name] is here with her mother for initial visit.  She is a healthy 36 yr old with no significant PMH. She has bilateral saline implants. She had genetic testing because her brother's daughter had ataxia telangiectasia and died of leukemia.  Interval history  Patient is here for follow-up.  Since her last visit, she had an MRI which did not show any evidence of malignancy. She denies any other complaints today.  She was recently started on Lexapro for anxiety and this has been well controlled.  No other breast changes reported.  Rest of the pertinent 10 point ROS reviewed and negative  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[facility])    Carrier of genetic disorder    high risk cancer mutation gene - high risk for breast and pancreatic cancer   Depression    History of kidney stones     SURGICAL HISTORY:  Bilateral breast augmentation  SOCIAL HISTORY: Social History   Socioeconomic History   Marital status: Single    Spouse name: Not on file   Number of children: 0   Years of  education: Not on file   Highest education level: Not on file  Occupational History   Occupation: Respiratory therapist  Tobacco Use   Smoking status: Never   Smokeless tobacco: Never  Vaping Use   Vaping Use: Never used  Substance and Sexual Activity   Alcohol use: Yes    Comment: 1 per week   Drug use: Never   Sexual activity: Not on file  Other Topics Concern   Not on file  Social History Narrative   Not on file   Social Determinants of Health   Financial Resource Strain: Not on file  Food Insecurity: Not on file  Transportation Needs: Not on file  Physical Activity: Not on file  Stress: Not on file  Social Connections: Not on file  Intimate Partner Violence: Not on file    FAMILY HISTORY: Family History  Problem Relation Age of Onset   Hypertension Mother    Crohn's disease Mother    Hypercholesterolemia Father    Hypertension Father    Heart disease Father    Diabetes Father    Melanoma Father    Esophageal cancer Father    Uterine cancer Maternal Grandmother    Breast cancer Maternal Grandmother    Lung cancer Maternal Grandmother    Heart disease Maternal Grandfather    Breast cancer Paternal  Grandmother    Osteoporosis Paternal Grandmother    CVA Paternal Grandfather    Diabetes Paternal Grandfather    Dementia Paternal Grandfather    Colon cancer Maternal Uncle    Breast cancer Paternal Aunt     ALLERGIES:  is allergic to miconazole and sulfa antibiotics.  MEDICATIONS:  Current Outpatient Medications  Medication Sig Dispense Refill   escitalopram (LEXAPRO) 20 MG tablet Take 20 mg by mouth daily.     zolpidem (AMBIEN) 10 MG tablet Take 10 mg by mouth at bedtime as needed.     fluticasone (FLONASE) 50 MCG/ACT nasal spray 1 spray by Both Nostrils route daily.     levocetirizine (XYZAL) 5 MG tablet Take 2.5 mg by mouth daily.     Levonorgestrel (SKYLA) 13.5 MG IUD Skyla 14 mcg/24 hrs (3 yrs) 13.5 mg intrauterine device  Take 1 device by  intrauterine route.     norethindrone-ethinyl estradiol-FE (LOESTRIN FE) 1-20 MG-MCG tablet Take 1 tablet by mouth daily.     spironolactone (ALDACTONE) 50 MG tablet Take 50 mg by mouth 2 (two) times daily.     traZODone (DESYREL) 50 MG tablet Take 50 mg by mouth at bedtime as needed.     tretinoin (RETIN-A) 0.1 % cream Apply 1 application topically at bedtime.     valACYclovir (VALTREX) 500 MG tablet Take 500 mg by mouth as needed.     No current facility-administered medications for this visit.   PHYSICAL EXAMINATION: ECOG PERFORMANCE STATUS: 0 - Asymptomatic  Vitals:   05/16/22 1305  BP: 128/87  Pulse: 77  Resp: 16  Temp: 97.7 F (36.5 C)  SpO2: 99%     Filed Weights   05/16/22 1305  Weight: 162 lb (73.5 kg)    GENERAL:alert, no distress and comfortable Breast: Bilateral breasts inspected and palpated.  She has saline implants.  No palpable masses or regional adenopathy Implants appear to be intact on exam. Lower extremities: no BLE edema.  LABORATORY DATA:  I have reviewed the data as listed Lab Results  Component Value Date   WBC 9.6 03/14/2015   HGB 13.6 03/14/2015   HCT 40.4 03/14/2015   MCV 91 03/14/2015   PLT 278 03/14/2015     Chemistry      Component Value Date/Time   NA 133 (L) 03/14/2015 1127   K 4.2 03/14/2015 1127   CL 101 03/14/2015 1127   CO2 25 03/14/2015 1127   BUN 16 03/14/2015 1127   CREATININE 0.91 03/14/2015 1127      Component Value Date/Time   CALCIUM 8.7 (L) 03/14/2015 1127   ALKPHOS 83 03/14/2015 1127   AST 61 (H) 03/14/2015 1127   ALT 63 (H) 03/14/2015 1127   BILITOT 0.5 03/14/2015 1127     Mammogram 03/2021  IMPRESSION: There is no radiographic evidence malignancy on the right.   RECOMMENDATION: Bilateral screening mammogram at age 62, unless otherwise clinically indicated.   ACR BI-RADS CATEGORY 2 - Benign.  Letter Sent: Benign Exam.   The patient was informed of these results and recommendations at the time of the  visit.   Electronically Signed by: Charlett Lango on 04/11/2021 3:28 PM  RADIOGRAPHIC STUDIES: I have personally reviewed the radiological images as listed and agreed with the findings in the report. MR BREAST BILATERAL W WO CONTRAST INC CAD  Result Date: 04/28/2022 CLINICAL DATA:  Follow-up from breast MRI dated 10/18/2021. At that time, patient had focal non-mass enhancement seen in the lower outer quadrants of each breast,  both without sonographic correlate. These areas of non-mass enhancement were deemed to be probably benign with recommendation for a six-month follow-up MRI. Patient presents today for that follow-up MRI. Per previous report, lifetime risk assessment calculation greater than 30% by [employer]. Monoallelic mutation of ATM gene. EXAM: BILATERAL BREAST MRI WITH AND WITHOUT CONTRAST TECHNIQUE: Multiplanar, multisequence MR images of both breasts were obtained prior to and following the intravenous administration of 7 ml of Gadavist Three-dimensional MR images were rendered by post-processing of the original MR data on an independent workstation. The three-dimensional MR images were interpreted, and findings are reported in the following complete MRI report for this study. Three dimensional images were evaluated at the independent interpreting workstation using the DynaCAD thin client. COMPARISON:  Breast MRI dated 10/18/2021. FINDINGS: Breast composition: b. Scattered fibroglandular tissue. Background parenchymal enhancement: Mild-to-moderate. Right breast: No suspicious enhancing mass, suspicious non-mass enhancement or secondary signs of malignancy within the RIGHT breast. Specifically, there is no suspicious non-mass enhancement identified in the lower outer quadrant of the RIGHT breast on today's exam. Left breast: No suspicious enhancing mass, suspicious non-mass enhancement or secondary signs of malignancy within the LEFT breast. Specifically, there is no suspicious non-mass  enhancement identified in the lower outer quadrant of the LEFT breast on today's exam. Lymph nodes: No abnormal appearing lymph nodes. Ancillary findings:  Bilateral prepectoral implants in place. IMPRESSION: No evidence of malignancy within either breast. Specifically, there is no suspicious non-mass enhancement identified within the lower outer quadrant of either breast, confirming benignity. No additional follow-up imaging is necessary for this benign finding. RECOMMENDATION: 1. Screening breast MRI in 1 year. Given patient's elevated risk for breast cancer, would consider the addition of annual screening breast MRI to annual screening mammography. Per American Cancer Society guidelines, annual screening MRI of the breasts is recommended if a risk assessment calculation for breast cancer, preferably using the Tyrer-Cuzick or Gail model, measures greater than 20% or for patients who are known or suspected to be positive for the breast cancer gene. 2. Annual screening mammograms. Next bilateral screening mammogram is now due (previous screening mammogram was in April of 2022). BI-RADS CATEGORY  1: Negative. Electronically Signed   By: Franki Cabot M.D.   On: 04/28/2022 14:06   She will be due for mammogram in November, ordered All questions were answered. The patient knows to call the clinic with any problems, questions or concerns. I spent 20 minutes in the care of this patient including H and P, review of records, counseling and coordination of care.    Benay Pike, MD 05/16/2022 1:35 PM

## 2022-08-14 HISTORY — PX: TUBAL LIGATION: SHX77

## 2022-10-02 ENCOUNTER — Ambulatory Visit
Admission: RE | Admit: 2022-10-02 | Discharge: 2022-10-02 | Disposition: A | Discharge status: Home / Self Care | Source: Ambulatory Visit | Attending: Hematology and Oncology | Admitting: Hematology and Oncology

## 2022-10-02 DIAGNOSIS — Z1501 Genetic susceptibility to malignant neoplasm of breast: Secondary | ICD-10-CM

## 2022-10-23 ENCOUNTER — Ambulatory Visit

## 2022-10-31 ENCOUNTER — Ambulatory Visit: Admitting: Nurse Practitioner

## 2022-10-31 ENCOUNTER — Encounter: Payer: Self-pay | Admitting: Nurse Practitioner

## 2022-10-31 ENCOUNTER — Other Ambulatory Visit (INDEPENDENT_AMBULATORY_CARE_PROVIDER_SITE_OTHER)

## 2022-10-31 VITALS — BP 108/82 | HR 96 | Ht 65.0 in | Wt 171.1 lb

## 2022-10-31 DIAGNOSIS — K625 Hemorrhage of anus and rectum: Secondary | ICD-10-CM

## 2022-10-31 DIAGNOSIS — R197 Diarrhea, unspecified: Secondary | ICD-10-CM

## 2022-10-31 LAB — CBC WITH DIFFERENTIAL/PLATELET
Basophils Absolute: 0.1 10*3/uL (ref 0.0–0.1)
Basophils Relative: 0.8 % (ref 0.0–3.0)
Eosinophils Absolute: 0.2 10*3/uL (ref 0.0–0.7)
Eosinophils Relative: 1.9 % (ref 0.0–5.0)
HCT: 39.2 % (ref 36.0–46.0)
Hemoglobin: 13.6 g/dL (ref 12.0–15.0)
Lymphocytes Relative: 26.7 % (ref 12.0–46.0)
Lymphs Abs: 2.1 10*3/uL (ref 0.7–4.0)
MCHC: 34.8 g/dL (ref 30.0–36.0)
MCV: 93.9 fl (ref 78.0–100.0)
Monocytes Absolute: 0.6 10*3/uL (ref 0.1–1.0)
Monocytes Relative: 8 % (ref 3.0–12.0)
Neutro Abs: 5 10*3/uL (ref 1.4–7.7)
Neutrophils Relative %: 62.6 % (ref 43.0–77.0)
Platelets: 318 10*3/uL (ref 150.0–400.0)
RBC: 4.18 Mil/uL (ref 3.87–5.11)
RDW: 13.2 % (ref 11.5–15.5)
WBC: 8 10*3/uL (ref 4.0–10.5)

## 2022-10-31 MED ORDER — NA SULFATE-K SULFATE-MG SULF 17.5-3.13-1.6 GM/177ML PO SOLN: 1.0000 | Freq: Once | ORAL | 0 refills | Status: AC

## 2022-10-31 NOTE — Patient Instructions (Addendum)
You have been scheduled for a colonoscopy. Please follow written instructions given to you at your visit today.  Please pick up your prep supplies at the pharmacy within the next 1-3 days. If you use inhalers (even only as needed), please bring them with you on the day of your procedure.   Your provider has requested that you go to the basement level for lab work before leaving today. Press "B" on the elevator. The lab is located at the first door on the left as you exit the elevator.   Apply a small amount of Desitin inside the anal opening and to the external anal area 3 times daily for 7 days.  No dairy products.  Due to recent changes in healthcare laws, you may see the results of your imaging and laboratory studies on MyChart before your provider has had a chance to review them.  We understand that in some cases there may be results that are confusing or concerning to you. Not all laboratory results come back in the same time frame and the provider may be waiting for multiple results in order to interpret others.  Please give [place] 48 hours in order for your provider to thoroughly review all the results before contacting the office for clarification of your results.    It was a pleasure to see you today!  Thank you for trusting me with your gastrointestinal care!

## 2022-10-31 NOTE — Progress Notes (Signed)
   10/31/2022 Amy Landry [id number] 05/22/1986   Chief Complaint: Rectal bleeding   History of Present Illness: Amy Landry is a 36 year old female with a past medical history of kidney stones and ATM gene mutation. She was initially seen in office by Dr. Armbruster 05/10/2021 to further discuss her ATM gene mutation status and to determined if she needed pancreatic and colon cancer screening. At that time, she was asymptomatic at that time therefore she was advised to undergo a screening colonoscopy at the age of 45 and MRCP at the age of 50. She presents today with complaints of diarrhea with blood and blood clots which occurred a few times over the past 3 months and sometimes she sees a small amount of bright red blood on the end of a formed stool.  Prior to the onset of bloody diarrhea she would infrequently see a's tiny bit of blood on the stool or toilet tissue which she attributes to having hemorrhoids.  She has 2-3 episodes of diarrhea 3 to 4 days weekly for the past 3 to 4 months.  She is passing solid stools on the day she does not have diarrhea.  Maternal uncle had colon cancer at the age of 40.  Mother with history of Crohn's disease.  No NSAID use.  No recent antibiotics.    Current Outpatient Medications on File Prior to Visit  Medication Sig Dispense Refill   escitalopram (LEXAPRO) 20 MG tablet Take 20 mg by mouth daily.     fluticasone (FLONASE) 50 MCG/ACT nasal spray 1 spray by Both Nostrils route daily.     levocetirizine (XYZAL) 5 MG tablet Take 2.5 mg by mouth daily.     spironolactone (ALDACTONE) 50 MG tablet Take 50 mg by mouth 2 (two) times daily.     traZODone (DESYREL) 50 MG tablet Take 50 mg by mouth at bedtime as needed.     tretinoin (RETIN-A) 0.1 % cream Apply 1 application topically at bedtime.     valACYclovir (VALTREX) 500 MG tablet Take 500 mg by mouth as needed.     No current facility-administered medications on file prior to visit.   Allergies   Allergen Reactions   Miconazole Rash    Labial swelling and burning   Sulfa Antibiotics Nausea And Vomiting   Current Medications, Allergies, Past Medical History, Past Surgical History, Family History and Social History were reviewed in [facility] electronic medical record.  Review of Systems:   Constitutional: Negative for fever, sweats, chills or weight loss.  Respiratory: Negative for shortness of breath.   Cardiovascular: Negative for chest pain, palpitations and leg swelling.  Gastrointestinal: See HPI.  Musculoskeletal: Negative for back pain or muscle aches.  Neurological: Negative for dizziness, headaches or paresthesias.   Physical Exam: BP 108/82   Pulse 96   Ht 5' 5" (1.651 m)   Wt 171 lb 2 oz (77.6 kg)   BMI 28.48 kg/m  Wt Readings from Last 3 Encounters:  10/31/22 171 lb 2 oz (77.6 kg)  05/16/22 162 lb (73.5 kg)  09/27/21 163 lb 1.6 oz (74 kg)    General: 36-year-old female in no acute distress. Head: Normocephalic and atraumatic. Eyes: No scleral icterus. Conjunctiva pink . Ears: Normal auditory acuity. Mouth: Dentition intact. No ulcers or lesions.  Lungs: Clear throughout to auscultation. Heart: Regular rate and rhythm, no murmur. Abdomen: Soft, nontender and nondistended. No masses or hepatomegaly. Normal bowel sounds x 4 quadrants.  Rectal: Small posterior skin tag/fold without an obvious   fissure. Internal hemorrhoids palpated without prolapse. No blood, stool or mass in the rectum. DD CMA present during exam.  Musculoskeletal: Symmetrical with no gross deformities. Extremities: No edema. Neurological: Alert oriented x 4. No focal deficits.  Psychological: Alert and cooperative. Normal mood and affect  Assessment and Recommendations:  40) 36 year old female with rectal bleeding with passing blood clots on a few occasions when having a diarrhea bowel movement. She intermittently sees a small amount of red blood on the end of a solid stool.  No  abdominal or rectal pain. -CBC, GI pathogen panel  Apply a small amount of Desitin inside the anal opening and to the external anal area tid as needed for anal or hemorrhoidal irritation/bleeding.  -No dairy products -Diagnostic colonoscopy benefits and risks discussed including risk with sedation, risk of bleeding, perforation and infection  -Patient to contact her office if her symptoms worsen  2) Heterozygous for ATM gene mutation

## 2022-11-01 NOTE — Progress Notes (Signed)
Agree with assessment and plan as outlined.  

## 2022-11-03 LAB — GI PROFILE, STOOL, PCR

## 2022-11-14 ENCOUNTER — Encounter: Admitting: Gastroenterology

## 2022-11-16 ENCOUNTER — Inpatient Hospital Stay: Attending: Hematology and Oncology | Admitting: Hematology and Oncology

## 2022-11-16 ENCOUNTER — Encounter: Payer: Self-pay | Admitting: Hematology and Oncology

## 2022-11-16 VITALS — BP 127/85 | HR 79 | Temp 97.5°F | Resp 18 | Wt 174.3 lb

## 2022-11-16 DIAGNOSIS — Z1501 Genetic susceptibility to malignant neoplasm of breast: Secondary | ICD-10-CM | POA: Diagnosis not present

## 2022-11-16 DIAGNOSIS — Z1509 Genetic susceptibility to other malignant neoplasm: Secondary | ICD-10-CM | POA: Insufficient documentation

## 2022-11-16 DIAGNOSIS — Z1589 Genetic susceptibility to other disease: Secondary | ICD-10-CM | POA: Insufficient documentation

## 2022-11-16 NOTE — Progress Notes (Signed)
[name] CONSULT NOTE  Patient Care Team: [name], NP as PCP - General (Adult Health Nurse Practitioner)  CHIEF COMPLAINTS/PURPOSE OF CONSULTATION:  ATM heterozygous mutation  ASSESSMENT & PLAN:   Monoallelic mutation of ATM gene This is a very pleasant 36 yr old female patient with PMH significant for breast augmentation with saline implantation referred to high risk breast clinic for recommendations regarding her ATM heterozygous gene mutation.  It is estimated that approximately 3 percent of White people in the [place] are ATM heterozygotes I discussed with the patient that having ATM gene mutation leads to 2-3 times increased risk of breast cancer which translates into a 30% lifetime risk of breast cancer. In two large cohort studies, it was thought that ER positive breast cancer is common than ER negative breast cancer among ATM carriers. For carriers who have a family history of ovarian cancer, we discuss the potential risks and benefits of rrBSO. For ATM carriers with a family history of pancreatic cancer, pancreatic cancer screening is offered with annual MRCP or EUS.  PLAN Most recent mammogram with no concerns, repeat MRI of the breast in May 2024, ordered Discussed about life style modification including regular exercise, limiting alcohol intake.  She is working on weight loss, limiting alcohol intake and regular exercise Return to clinic in Aug 2024   Orders Placed This Encounter  Procedures   MR BREAST BILATERAL [facility] [place] [facility]    Standing Status:   Future    Standing Expiration Date:   11/17/2023    Order Specific Question:   If indicated for the ordered procedure, I authorize the administration of contrast media per Radiology protocol    Answer:   Yes    Order Specific Question:   What is the patient's sedation requirement?    Answer:   No Sedation    Order Specific Question:   Does the patient have a pacemaker or implanted  devices?    Answer:   No    Order Specific Question:   Preferred imaging location?    Answer:   [facility] (table limit - 550 lbs)     HISTORY OF PRESENTING ILLNESS:   Amy Landry 36 y.o. female is here because of ATM mutation. Amy Landry is here with her mother for initial visit. She is a healthy 36 yr old with no significant PMH. She has bilateral saline implants. She had genetic testing because her brother's daughter had ataxia telangiectasia and died of leukemia.  Interval history  Patient is here for follow-up.  She is doing well since last visit. She had her tubes tied, no more hormonal birth control. She was going through a rough time, started eating unhealthy, drinking alcohol. She has now modified diet and working out. Colonoscopy scheduled on Monday. Since her last visit, she had a mammogram which did not show any evidence of malignancy Lexapro has been working no other breast changes reported.   Rest of the pertinent 10 point ROS reviewed and negative  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HCC)    Carrier of genetic disorder    high risk cancer mutation gene - high risk for breast and pancreatic cancer   Depression    History of kidney stones     SURGICAL HISTORY:  Bilateral breast augmentation  SOCIAL HISTORY: Social History   Socioeconomic History  Marital status: Single    Spouse name: Not on file   Number of children: 0   Years of education: Not on file   Highest education level: Not on file  Occupational History   Occupation: Respiratory therapist  Tobacco Use   Smoking status: Never   Smokeless tobacco: Never  Vaping Use   Vaping Use: Never used  Substance and Sexual Activity   Alcohol use: Yes    Comment: 1 per week   Drug use: Never   Sexual activity: Not on file  Other Topics Concern   Not on file  Social History Narrative   Not on file    Social Determinants of Health   Financial Resource Strain: Not on file  Food Insecurity: Not on file  Transportation Needs: Not on file  Physical Activity: Not on file  Stress: Not on file  Social Connections: Not on file  Intimate Partner Violence: Not on file    FAMILY HISTORY: Family History  Problem Relation Age of Onset   Hypertension Mother    Crohn's disease Mother    Hypercholesterolemia Father    Hypertension Father    Heart disease Father    Diabetes Father    Melanoma Father    Esophageal cancer Father    Uterine cancer Maternal Grandmother    Breast cancer Maternal Grandmother    Lung cancer Maternal Grandmother    Heart disease Maternal Grandfather    Breast cancer Paternal Grandmother    Osteoporosis Paternal Grandmother    CVA Paternal Grandfather    Diabetes Paternal Grandfather    Dementia Paternal Grandfather    Colon cancer Maternal Uncle    Breast cancer Paternal Aunt     ALLERGIES:  is allergic to miconazole and sulfa antibiotics.  MEDICATIONS:  Current Outpatient Medications  Medication Sig Dispense Refill   escitalopram (LEXAPRO) 20 MG tablet Take 20 mg by mouth daily.     fluticasone (FLONASE) 50 MCG/ACT nasal spray 1 spray by Both Nostrils route daily.     levocetirizine (XYZAL) 5 MG tablet Take 2.5 mg by mouth daily.     spironolactone (ALDACTONE) 50 MG tablet Take 50 mg by mouth 2 (two) times daily.     traZODone (DESYREL) 50 MG tablet Take 50 mg by mouth at bedtime as needed.     tretinoin (RETIN-A) 0.1 % cream Apply 1 application topically at bedtime.     valACYclovir (VALTREX) 500 MG tablet Take 500 mg by mouth as needed.     No current facility-administered medications for this visit.   PHYSICAL EXAMINATION: ECOG PERFORMANCE STATUS: 0 - Asymptomatic  Vitals:   11/16/22 0930  BP: 127/85  Pulse: 79  Resp: 18  Temp: (!) 97.5 F (36.4 C)  SpO2: 98%     Filed Weights   11/16/22 0930  Weight: 174 lb 5 oz (79.1 kg)     GENERAL:alert, no distress and comfortable Breast: Bilateral breasts inspected and palpated.   She has saline implants.  No palpable masses or regional adenopathy Implants appear to be intact on exam. Lower extremities: no BLE edema.  LABORATORY DATA:  I have reviewed the data as listed Lab Results  Component Value Date   WBC 8.0 10/31/2022   HGB 13.6 10/31/2022   HCT 39.2 10/31/2022   MCV 93.9 10/31/2022   PLT 318.0 10/31/2022     Chemistry      Component Value Date/Time   NA 133 (L) 03/14/2015 1127   K 4.2 03/14/2015 1127   CL  101 03/14/2015 1127   CO2 25 03/14/2015 1127   BUN 16 03/14/2015 1127   CREATININE 0.91 03/14/2015 1127      Component Value Date/Time   CALCIUM 8.7 (L) 03/14/2015 1127   ALKPHOS 83 03/14/2015 1127   AST 61 (H) 03/14/2015 1127   ALT 63 (H) 03/14/2015 1127   BILITOT 0.5 03/14/2015 1127     Mammogram 03/2021  IMPRESSION: There is no radiographic evidence malignancy on the right.   RECOMMENDATION: Bilateral screening mammogram at age 6, unless otherwise clinically indicated.   ACR BI-RADS CATEGORY 2 - Benign.  Letter Sent: Benign Exam.   The patient was informed of these results and recommendations at the time of the visit.   Electronically Signed by: Charlett Lango on 04/11/2021 3:28 PM August 2024  RADIOGRAPHIC STUDIES: I have personally reviewed the radiological images as listed and agreed with the findings in the report. No results found.  She will be due for mammogram in November, ordered All questions were answered. The patient knows to call the clinic with any problems, questions or concerns. I spent 20 minutes in the care of this patient including H and P, review of records, counseling and coordination of care.    Benay Pike, MD 11/16/2022 11:42 AM

## 2022-11-16 NOTE — Assessment & Plan Note (Signed)
This is a very pleasant 36 yr old female patient with PMH significant for breast augmentation with saline implantation referred to high risk breast clinic for recommendations regarding her ATM heterozygous gene mutation.  It is estimated that approximately 3 percent of White people in the [place] are ATM heterozygotes I discussed with the patient that having ATM gene mutation leads to 2-3 times increased risk of breast cancer which translates into a 30% lifetime risk of breast cancer. In two large cohort studies, it was thought that ER positive breast cancer is common than ER negative breast cancer among ATM carriers. For carriers who have a family history of ovarian cancer, we discuss the potential risks and benefits of rrBSO. For ATM carriers with a family history of pancreatic cancer, pancreatic cancer screening is offered with annual MRCP or EUS.  PLAN Most recent mammogram with no concerns, repeat MRI of the breast in May 2024, ordered Discussed about life style modification including regular exercise, limiting alcohol intake.  She is working on weight loss, limiting alcohol intake and regular exercise Return to clinic in Aug 2024

## 2022-11-20 ENCOUNTER — Encounter: Payer: Self-pay | Admitting: Gastroenterology

## 2022-11-20 ENCOUNTER — Ambulatory Visit (AMBULATORY_SURGERY_CENTER): Admitting: Gastroenterology

## 2022-11-20 VITALS — BP 107/66 | HR 65 | Temp 97.5°F | Resp 14 | Ht 65.0 in | Wt 171.0 lb

## 2022-11-20 DIAGNOSIS — D123 Benign neoplasm of transverse colon: Secondary | ICD-10-CM

## 2022-11-20 DIAGNOSIS — K5120 Ulcerative (chronic) proctitis without complications: Secondary | ICD-10-CM

## 2022-11-20 DIAGNOSIS — D122 Benign neoplasm of ascending colon: Secondary | ICD-10-CM | POA: Diagnosis not present

## 2022-11-20 DIAGNOSIS — K635 Polyp of colon: Secondary | ICD-10-CM | POA: Diagnosis not present

## 2022-11-20 DIAGNOSIS — K625 Hemorrhage of anus and rectum: Secondary | ICD-10-CM

## 2022-11-20 DIAGNOSIS — R197 Diarrhea, unspecified: Secondary | ICD-10-CM

## 2022-11-20 DIAGNOSIS — K529 Noninfective gastroenteritis and colitis, unspecified: Secondary | ICD-10-CM | POA: Diagnosis not present

## 2022-11-20 MED ORDER — MESALAMINE 1000 MG RE SUPP: 1000.0000 mg | Freq: Every day | RECTAL | Status: DC

## 2022-11-20 MED ORDER — SODIUM CHLORIDE 0.9 % IV SOLN: 500.0000 mL | Freq: Once | INTRAVENOUS | Status: DC

## 2022-11-20 NOTE — Progress Notes (Signed)
Pt resting comfortably. VSS. Airway intact. SBAR complete to RN. All questions answered.

## 2022-11-20 NOTE — Progress Notes (Signed)
History and Physical Interval Note: Patient seen on 10/31/22 - no interval changes. Symptoms of loose stools and rectal bleeding for a few months now. Mother had Crohn's disease. History of ATM mutation.  Infectious workup negative, no anemia. Colonoscopy to further evaluate, she wishes to proceed after discussion of the procedure.   11/20/2022 2:45 PM  Amy Landry  has presented today for endoscopic procedure(s), with the diagnosis of  Encounter Diagnoses  Name Primary?   Rectal bleeding Yes   Diarrhea, unspecified type   .  The various methods of evaluation and treatment have been discussed with the patient and/or family. After consideration of risks, benefits and other options for treatment, the patient has consented to  the endoscopic procedure(s).   The patient's history has been reviewed, patient examined, no change in status, stable for surgery.  I have reviewed the patient's chart and labs.  Questions were answered to the patient's satisfaction.    Jolly Mango, MD [facility]

## 2022-11-20 NOTE — Op Note (Signed)
[place] Patient Name: Amy Landry Procedure Date: 11/20/2022 2:44 PM MRN: [id number] Endoscopist: Remo Lipps P. Havery Moros , MD, [id number] Age: 36 Referring MD:  Date of Birth: 09-08-1986 Gender: Female Account #: [id number] Procedure:                Colonoscopy Indications:              Loose stools, Rectal bleeding - family history of                            Crohn's disease Medicines:                Monitored Anesthesia Care Procedure:                Pre-Anesthesia Assessment:                           - Prior to the procedure, a History and Physical                            was performed, and patient medications and                            allergies were reviewed. The patient's tolerance of                            previous anesthesia was also reviewed. The risks                            and benefits of the procedure and the sedation                            options and risks were discussed with the patient.                            All questions were answered, and informed consent                            was obtained. Prior Anticoagulants: The patient has                            taken no anticoagulant or antiplatelet agents. ASA                            Grade Assessment: I - A normal, healthy patient.                            After reviewing the risks and benefits, the patient                            was deemed in satisfactory condition to undergo the                            procedure.  After obtaining informed consent, the colonoscope                            was passed under direct vision. Throughout the                            procedure, the patient's blood pressure, pulse, and                            oxygen saturations were monitored continuously. The                            PCF-HQ190L Colonoscope was introduced through the                            anus and advanced to the the terminal ileum,  with                            identification of the appendiceal orifice and IC                            valve. The colonoscopy was performed without                            difficulty. The patient tolerated the procedure                            well. The quality of the bowel preparation was                            good. The terminal ileum, ileocecal valve,                            appendiceal orifice, and rectum were photographed. Scope In: 2:53:35 PM Scope Out: 3:10:21 PM Scope Withdrawal Time: 0 hours 14 minutes 28 seconds  Total Procedure Duration: 0 hours 16 minutes 46 seconds  Findings:                 The perianal and digital rectal examinations were                            normal.                           The terminal ileum contained a few non-bleeding                            mild erosions. Biopsies were taken with a cold                            forceps for histology.                           A 6 mm polyp was found in the ascending colon. The  polyp was flat. The polyp was removed with a cold                            snare. Resection and retrieval were complete.                           A 4 mm polyp was found in the transverse colon. The                            polyp was flat. The polyp was removed with a cold                            snare. Resection and retrieval were complete.                           Localized mild inflammation characterized by                            erythema, friability, granularity and loss of                            vascularity was found in the distal rectum (distal                            2cm). Biopsies were taken with a cold forceps for                            histology.                           The exam was otherwise without abnormality. No                            inflammation noted anywhere else in the colon. Complications:            No immediate complications. Estimated  blood loss:                            Minimal. Estimated Blood Loss:     Estimated blood loss was minimal. Impression:               - A few diminutive erosions in the terminal ileum -                            nonspecific. Biopsied.                           - One 6 mm polyp in the ascending colon, removed                            with a cold snare. Resected and retrieved.                           - One 4 mm polyp in the transverse colon, removed  with a cold snare. Resected and retrieved.                           - Localized mild inflammation was found in the                            distal 2cm of the rectum. Biopsied.                           - The examination was otherwise normal.                           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 Recommendation:           - Patient has a contact number available for                            emergencies. The signs and symptoms of potential                            delayed complications were discussed with the                            patient. Return to normal activities tomorrow.                            Written discharge instructions were provided to the                            patient.                           - Resume previous diet.                           - Continue present medications.                           - Start Mesalamine suppositories 1cm q HS (genetic                            if possible, if not, Canasa suppositories)                           - Await pathology results with further                            recommendations.                           - Minimize NSAID use [name] [name], MD 11/20/2022 3:17:02 PM This report has been signed electronically.

## 2022-11-20 NOTE — Progress Notes (Signed)
Called to room to assist during endoscopic procedure.  Patient ID and intended procedure confirmed with present staff. Received instructions for my participation in the procedure from the performing physician.  

## 2022-11-20 NOTE — Patient Instructions (Addendum)
Patient has a contact number available for emergencies. The signs and symptoms of potential delayed complications were discussed with the patient. Return to normal activities tomorrow. Written discharge instructions were provided to the patient. - Resume previous diet. - Continue present medications. - Start Mesalamine suppositories 1cm q HS (generic if possible, if not, Canasa suppositories) - Await pathology results with further recommendations. - Minimize NSAID use  YOU HAD AN ENDOSCOPIC PROCEDURE TODAY: Refer to the procedure report and other information in the discharge instructions given to you for any specific questions about what was found during the examination. If this information does not answer your questions, please call [place] office at [phone] to clarify.   YOU SHOULD EXPECT: Some feelings of bloating in the abdomen. Passage of more gas than usual. Walking can help get rid of the air that was put into your GI tract during the procedure and reduce the bloating. If you had a lower endoscopy (such as a colonoscopy or flexible sigmoidoscopy) you may notice spotting of blood in your stool or on the toilet paper. Some abdominal soreness may be present for a day or two, also.  DIET: Your first meal following the procedure should be a light meal and then it is ok to progress to your normal diet. A half-sandwich or bowl of soup is an example of a good first meal. Heavy or fried foods are harder to digest and may make you feel nauseous or bloated. Drink plenty of fluids but you should avoid alcoholic beverages for 24 hours. If you had a esophageal dilation, please see attached instructions for diet.    ACTIVITY: Your care partner should take you home directly after the procedure. You should plan to take it easy, moving slowly for the rest of the day. You can resume normal activity the day after the procedure however YOU SHOULD NOT DRIVE, use power tools, machinery or perform tasks that  involve climbing or major physical exertion for 24 hours (because of the sedation medicines used during the test).   SYMPTOMS TO REPORT IMMEDIATELY: A gastroenterologist can be reached at any hour. Please call [phone]  for any of the following symptoms:  Following lower endoscopy (colonoscopy, flexible sigmoidoscopy) Excessive amounts of blood in the stool  Significant tenderness, worsening of abdominal pains  Swelling of the abdomen that is new, acute  Fever of 100 or higher  Following upper endoscopy (EGD, EUS, ERCP, esophageal dilation) Vomiting of blood or coffee ground material  New, significant abdominal pain  New, significant chest pain or pain under the shoulder blades  Painful or persistently difficult swallowing  New shortness of breath  Black, tarry-looking or red, bloody stools  FOLLOW UP:  If any biopsies were taken you will be contacted by phone or by letter within the next 1-3 weeks. Call [phone]  if you have not heard about the biopsies in 3 weeks.  Please also call with any specific questions about appointments or follow up tests.YOU HAD AN ENDOSCOPIC PROCEDURE TODAY: Refer to the procedure report and other information in the discharge instructions given to you for any specific questions about what was found during the examination. If this information does not answer your questions, please call [place] office at [phone] to clarify.   YOU SHOULD EXPECT: Some feelings of bloating in the abdomen. Passage of more gas than usual. Walking can help get rid of the air that was put into your GI tract during the procedure and reduce the bloating. If you had a lower endoscopy (  such as a colonoscopy or flexible sigmoidoscopy) you may notice spotting of blood in your stool or on the toilet paper. Some abdominal soreness may be present for a day or two, also.  DIET: Your first meal following the procedure should be a light meal and then it is ok to progress to your normal  diet. A half-sandwich or bowl of soup is an example of a good first meal. Heavy or fried foods are harder to digest and may make you feel nauseous or bloated. Drink plenty of fluids but you should avoid alcoholic beverages for 24 hours. If you had a esophageal dilation, please see attached instructions for diet.    ACTIVITY: Your care partner should take you home directly after the procedure. You should plan to take it easy, moving slowly for the rest of the day. You can resume normal activity the day after the procedure however YOU SHOULD NOT DRIVE, use power tools, machinery or perform tasks that involve climbing or major physical exertion for 24 hours (because of the sedation medicines used during the test).   SYMPTOMS TO REPORT IMMEDIATELY: A gastroenterologist can be reached at any hour. Please call [phone]  for any of the following symptoms:  Following lower endoscopy (colonoscopy, flexible sigmoidoscopy) Excessive amounts of blood in the stool  Significant tenderness, worsening of abdominal pains  Swelling of the abdomen that is new, acute  Fever of 100 or higher   FOLLOW UP:  If any biopsies were taken you will be contacted by phone or by letter within the next 1-3 weeks. Call [phone]  if you have not heard about the biopsies in 3 weeks.  Please also call with any specific questions about appointments or follow up tests.

## 2022-11-21 ENCOUNTER — Encounter: Payer: Self-pay | Admitting: Gastroenterology

## 2022-11-21 ENCOUNTER — Telehealth: Payer: Self-pay | Admitting: *Deleted

## 2022-11-21 DIAGNOSIS — K625 Hemorrhage of anus and rectum: Secondary | ICD-10-CM

## 2022-11-21 MED ORDER — MESALAMINE 1000 MG RE SUPP: 1000.0000 mg | Freq: Every day | RECTAL | 1 refills | Status: DC

## 2022-11-21 NOTE — Telephone Encounter (Signed)
Post procedure follow up call placed, no answer and left VM.

## 2022-12-16 ENCOUNTER — Other Ambulatory Visit: Payer: Self-pay | Admitting: Gastroenterology

## 2022-12-16 DIAGNOSIS — K625 Hemorrhage of anus and rectum: Secondary | ICD-10-CM

## 2023-01-25 ENCOUNTER — Other Ambulatory Visit (INDEPENDENT_AMBULATORY_CARE_PROVIDER_SITE_OTHER)

## 2023-01-25 ENCOUNTER — Encounter: Payer: Self-pay | Admitting: Gastroenterology

## 2023-01-25 ENCOUNTER — Ambulatory Visit: Admitting: Gastroenterology

## 2023-01-25 VITALS — BP 110/78 | HR 97 | Ht 65.0 in | Wt 176.0 lb

## 2023-01-25 DIAGNOSIS — K5120 Ulcerative (chronic) proctitis without complications: Secondary | ICD-10-CM

## 2023-01-25 DIAGNOSIS — Z86010 Personal history of colonic polyps: Secondary | ICD-10-CM | POA: Diagnosis not present

## 2023-01-25 LAB — BASIC METABOLIC PANEL
BUN: 17 mg/dL (ref 6–23)
CO2: 25 mEq/L (ref 19–32)
Calcium: 9.3 mg/dL (ref 8.4–10.5)
Chloride: 101 mEq/L (ref 96–112)
Creatinine, Ser: 1.08 mg/dL (ref 0.40–1.20)
GFR: 66.16 mL/min (ref >60.00)
Glucose, Bld: 92 mg/dL (ref 70–99)
Potassium: 3.9 mEq/L (ref 3.5–5.1)
Sodium: 136 mEq/L (ref 135–145)

## 2023-01-25 NOTE — Patient Instructions (Signed)
If you are age 37 or older, your body mass index should be between 23-30. Your Body mass index is 29.29 kg/m. If this is out of the aforementioned range listed, please consider follow up with your Primary Care Provider.  If you are age 7 or younger, your body mass index should be between 19-25. Your Body mass index is 29.29 kg/m. If this is out of the aformentioned range listed, please consider follow up with your Primary Care Provider.   ________________________________________________________  Amy Landry.  Please go to the lab in the basement of our building to have lab work done as you leave today. Hit "B" for basement when you get on the elevator.  When the doors open the lab is on your left.  We will call you with the results. Thank you.   Thank you for entrusting me with your care and for choosing [facility], Dr. [place]ellar

## 2023-01-25 NOTE — Progress Notes (Signed)
HPI :  37 year old female heterozygous for ATM gene mutation, here for follow up after recent colonoscopy showing colitis.   Recall she was seen in the office in November for loose stools and bright red blood per rectum for a few months.  Her mother has Crohn's disease.  She underwent colonoscopy with me on December 4 showing a few diminutive erosions in her ileum and inflammation of the distal 2 cm of her rectum.  Incidentally had 2 small polyps removed which were sessile serrated.  Pathology from her colonoscopy as outlined below.  Nonspecific mild ileitis, and chronic active inflammation of her colon.  She was placed on Canasa suppositories.  She has been using this once nightly since have seen her.  She states this has resolved her bowel symptoms and she is feeling pretty well.  If she misses some dosing of it however she can have some recurrent symptoms.  She tolerates the suppository well and has no problems with it.  She currently denies any diarrhea, no blood in her stools.  No abdominal pains.  No vomiting.  She does not use any NSAIDs.  We discussed colitis in general/IBD, goals for treatment, long-term plans etc.   She has had a few CT scans over the past few years for her renal stones.  She has had a CT scan in April 2015, March 2016, August 2020 which all showed a normal-appearing pancreas.  Colonoscopy 11/20/22: - A few diminutive erosions in the terminal ileum - nonspecific. Biopsied. - One 6 mm polyp in the ascending colon, removed with a cold snare. Resected and retrieved. - One 4 mm polyp in the transverse colon, removed with a cold snare. Resected and retrieved. - Localized mild inflammation was found in the distal 2cm of the rectum. Biopsied. - The examination was otherwise normal.                            1. Surgical [P], small bowel, ileum - MILDLY ACTIVE NONSPECIFIC ILEITIS - NEGATIVE FOR DEFINITE FEATURES OF CHRONICITY OR GRANULOMAS 2. Surgical [P], colon, ascending,  transverse, polyp (2) - SESSILE SERRATED POLYP(S) WITHOUT CYTOLOGIC DYSPLASIA 3. Surgical [P], colon, rectum - MODERATELY ACTIVE CHRONIC PROCTITIS, SEE COMMENT - NEGATIVE FOR GRANULOMAS OR DYSPLASIA  3. The biopsies show mild architectural distortion, full-thickness lamina propria and basal lymphoplasmacytosis and cryptitis. 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 Clinical and radiologic correlation is suggested   Past Medical History:  Diagnosis Date   Abnormal Pap smear of cervix    LEEP    Anxiety    Ataxia telangiectasia (ATM) (HCC)    Carrier of genetic disorder    high risk cancer mutation gene - high risk for breast and pancreatic cancer   Depression    History of kidney stones      Past Surgical History:  Procedure Laterality Date   AUGMENTATION MAMMAPLASTY Bilateral 07/2010   CERVICAL BIOPSY  W/ LOOP ELECTRODE EXCISION  2014   PLACEMENT OF BREAST IMPLANTS  07/2010   TUBAL LIGATION  08/14/2022   WISDOM TOOTH EXTRACTION     Family History  Problem Relation Age of Onset   Hypertension Mother    Crohn's disease Mother    Hypercholesterolemia Father    Hypertension Father    Heart disease Father    Diabetes Father    Melanoma Father    Esophageal cancer Father    Colon cancer Maternal Uncle  Breast cancer Paternal Aunt    Uterine cancer Maternal Grandmother    Breast cancer Maternal Grandmother    Lung cancer Maternal Grandmother    Heart disease Maternal Grandfather    Breast cancer Paternal Grandmother    Osteoporosis Paternal Grandmother    CVA Paternal Grandfather    Diabetes Paternal Grandfather    Dementia Paternal Grandfather    Rectal cancer Neg Hx    Stomach cancer Neg Hx    Social History   Tobacco Use   Smoking status: Never   Smokeless tobacco: Never  Vaping Use   Vaping Use: Never used  Substance Use Topics   Alcohol use: Yes    Comment: 1 per week   Drug use: Never   Current Outpatient  Medications  Medication Sig Dispense Refill   escitalopram (LEXAPRO) 20 MG tablet Take 20 mg by mouth daily.     fluticasone (FLONASE) 50 MCG/ACT nasal spray 1 spray by Both Nostrils route daily.     levocetirizine (XYZAL) 5 MG tablet Take 2.5 mg by mouth daily.     mesalamine (CANASA) 1000 MG suppository PLACE 1 SUPPOSITORY (1,000 MG TOTAL) RECTALLY AT BEDTIME. 90 suppository 1   minocycline (MINOCIN) 100 MG capsule Take 100 mg by mouth daily.     spironolactone (ALDACTONE) 50 MG tablet Take 50 mg by mouth 2 (two) times daily.     traZODone (DESYREL) 50 MG tablet Take 50 mg by mouth at bedtime as needed.     tretinoin (RETIN-A) 0.1 % cream Apply 1 application topically at bedtime.     valACYclovir (VALTREX) 500 MG tablet Take 500 mg by mouth as needed.     No current facility-administered medications for this visit.   Allergies  Allergen Reactions   Miconazole Rash    Labial swelling and burning   Sulfa Antibiotics Nausea And Vomiting     Review of Systems: All systems reviewed and negative except where noted in HPI.   Lab Results  Component Value Date   WBC 8.0 10/31/2022   HGB 13.6 10/31/2022   HCT 39.2 10/31/2022   MCV 93.9 10/31/2022   PLT 318.0 10/31/2022     Physical Exam: BP 110/78   Pulse 97   Ht '5\' 5"'$  (1.651 m)   Wt 176 lb (79.8 kg)   SpO2 97%   BMI 29.29 kg/m  Constitutional: Pleasant,well-developed, female in no acute distress. Neurological: Alert and oriented to person place and time. Psychiatric: Normal mood and affect. Behavior is normal.   ASSESSMENT: 37 y.o. female here for assessment of the following  1. 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 History of colon polyps    I suspect the patient more likely has UC although Crohn's does remain possible.  She had very diminutive erosions in her ileum that were scattered, biopsies nonspecific.  She could have small bowel Crohn's however if she does she has no symptoms from it, no anemia.   Biopsies of her small bowel not necessarily consistent with Crohn's.  She has responded incredibly well to Canasa thus far.  We discussed goals of therapy are to clinically have her in remission and minimize her risk for colon cancer moving forward.  She is very happy with the regimen.  We discussed potential risks, generally very safe, has risks for renal dysfunction, will check be met to make sure normal.  She is feeling well, we will plan on checking a fecal calprotectin to make sure normal.  If she has abdominal pain, worsening  diarrhea despite Canasa etc., or if fecal calprotectin remains elevated, may consider enterography study to further evaluate her small intestine or capsule endoscopy.  We discussed both of those today.  We will hold off on further evaluation of her small intestine at this time given she clinically has no symptoms of small bowel disease and if she did have very mild small bowel Crohn's we probably would monitor and hold off on biologic right now given she feels well without anemia etc.  She agrees with the plan, will await lab work and fecal calprotectin and her course on Canasa.  She will see me yearly if not sooner with issues.  Repeat colonoscopy in 5 years for history of sessile serrated polyps.  PLAN: - counseled on IBD at length above - continue Canasa q HS - lab today for BMET  - lab for fecal calprotectin - f/u 1 year or sooner with issues  Jolly Mango, MD [facility]

## 2023-01-30 ENCOUNTER — Other Ambulatory Visit

## 2023-01-30 DIAGNOSIS — K5120 Ulcerative (chronic) proctitis without complications: Secondary | ICD-10-CM

## 2023-02-03 LAB — CALPROTECTIN, FECAL: Calprotectin, Fecal: 72 ug/g (ref 0–120)

## 2023-05-10 ENCOUNTER — Ambulatory Visit (HOSPITAL_COMMUNITY)
Admission: RE | Admit: 2023-05-10 | Discharge: 2023-05-10 | Disposition: A | Discharge status: Home / Self Care | Source: Ambulatory Visit | Attending: Hematology and Oncology | Admitting: Hematology and Oncology

## 2023-05-10 DIAGNOSIS — Z1589 Genetic susceptibility to other disease: Secondary | ICD-10-CM | POA: Diagnosis present

## 2023-05-10 DIAGNOSIS — Z1509 Genetic susceptibility to other malignant neoplasm: Secondary | ICD-10-CM | POA: Insufficient documentation

## 2023-05-10 DIAGNOSIS — Z1501 Genetic susceptibility to malignant neoplasm of breast: Secondary | ICD-10-CM | POA: Diagnosis present

## 2023-05-10 MED ORDER — GADOBUTROL 1 MMOL/ML IV SOLN
8.0000 mL | Freq: Once | INTRAVENOUS | Status: AC | PRN
  Administered 2023-05-10: 8 mL via INTRAVENOUS

## 2023-07-10 ENCOUNTER — Telehealth: Payer: Self-pay | Admitting: Hematology and Oncology

## 2023-07-10 NOTE — Telephone Encounter (Signed)
Patient is aware of rescheduled appointment times/dates 

## 2023-07-23 ENCOUNTER — Ambulatory Visit: Admitting: Hematology and Oncology

## 2023-08-29 ENCOUNTER — Inpatient Hospital Stay: Attending: Hematology and Oncology | Admitting: Hematology and Oncology

## 2023-08-29 VITALS — BP 108/66 | HR 98 | Temp 98.1°F | Resp 16 | Wt 156.8 lb

## 2023-08-29 DIAGNOSIS — Z1509 Genetic susceptibility to other malignant neoplasm: Secondary | ICD-10-CM | POA: Diagnosis not present

## 2023-08-29 DIAGNOSIS — Z1501 Genetic susceptibility to malignant neoplasm of breast: Secondary | ICD-10-CM | POA: Diagnosis not present

## 2023-08-29 DIAGNOSIS — Z1589 Genetic susceptibility to other disease: Secondary | ICD-10-CM | POA: Diagnosis present

## 2023-08-29 NOTE — Progress Notes (Signed)
[facility] CONSULT NOTE  Patient Care Team: Amy Apley, NP as PCP - General (Adult Health Nurse Practitioner)  CHIEF COMPLAINTS/PURPOSE OF CONSULTATION:  ATM heterozygous mutation  ASSESSMENT & PLAN:   Monoallelic mutation of ATM gene This is a very pleasant 37 yr old female patient with PMH significant for breast augmentation with saline implantation referred to high risk breast clinic for recommendations regarding her ATM heterozygous gene mutation.  It is estimated that approximately 3 percent of White people in the [place] are ATM heterozygotes I discussed with the patient that having ATM gene mutation leads to 2-3 times increased risk of breast cancer which translates into a 30% lifetime risk of breast cancer. In two large cohort studies, it was thought that ER positive breast cancer is common than ER negative breast cancer among ATM carriers. For carriers who have a family history of ovarian cancer, we discuss the potential risks and benefits of rrBSO. For ATM carriers with a family history of pancreatic cancer, pancreatic cancer screening is offered with annual MRCP or EUS.  PLAN No concern for breast cancer on exam today.  She will alternate between mammograms and MRIs and these have been ordered. She will follow-up with her PCP in about 6 months with a breast exam.  She will report back to high risk breast clinic in about a year or sooner as needed.  She will continue self breast exams. She will continue on his abound in the interim for weight loss and this is being monitored by her physician.  There has been no update in family's history since her last visit here.    Orders Placed This Encounter  Procedures   MM 3D SCREENING MAMMOGRAM BILATERAL BREAST    Standing Status:   Future    Standing Expiration Date:   08/28/2024    Order Specific Question:   Reason for Exam (SYMPTOM  OR DIAGNOSIS REQUIRED)    Answer:   ATM mutation, at high risk for [facility]     Order Specific Question:   Preferred imaging location?    Answer:   [facility]    Order Specific Question:   Is the patient pregnant?    Answer:   No   MR BREAST BILATERAL W WO CONTRAST INC CAD    Standing Status:   Future    Standing Expiration Date:   08/28/2024    Order Specific Question:   If indicated for the ordered procedure, I authorize the administration of contrast media per Radiology protocol    Answer:   Yes    Order Specific Question:   What is the patient's sedation requirement?    Answer:   No Sedation    Order Specific Question:   Does the patient have a pacemaker or implanted devices?    Answer:   No    Order Specific Question:   Preferred imaging location?    Answer:   [facility] (table limit-550lbs)     HISTORY OF PRESENTING ILLNESS:   Amy Landry 37 y.o. female is here because of ATM mutation. Amy Landry is here with her mother for initial visit. She is a healthy 37 yr old with no significant PMH. She has bilateral saline implants. She had genetic testing because her brother's daughter had ataxia telangiectasia and died of leukemia.  Interval history  Patient is here for follow-up.  Since her last visit here, she is now on 7 pounds and has lost about 20 pounds of weight and  she is very happy.  She has come off of Lexapro since she wondered if the weight gain was from this medication.  She feels quite well except for the nausea from his abound.  She has denied any breast changes.  She had her last MRI which did not show any evidence of malignancy. She also diagnosed with ulcerative colitis of rectum and is now on suppositories.  Rest of the pertinent 10 point ROS reviewed and negative  MEDICAL HISTORY:   Past Medical History:  Diagnosis Date   Abnormal Pap smear of cervix    LEEP    Anxiety    Ataxia telangiectasia (ATM) (HCC)    Carrier of genetic disorder    high risk cancer mutation gene - high risk for breast and pancreatic cancer    Depression    History of kidney stones     SURGICAL HISTORY:  Bilateral breast augmentation  SOCIAL HISTORY: Social History   Socioeconomic History   Marital status: Single    Spouse name: Not on file   Number of children: 0   Years of education: Not on file   Highest education level: Not on file  Occupational History   Occupation: Respiratory therapist  Tobacco Use   Smoking status: Never   Smokeless tobacco: Never  Vaping Use   Vaping status: Never Used  Substance and Sexual Activity   Alcohol use: Yes    Comment: 1 per week   Drug use: Never   Sexual activity: Not on file  Other Topics Concern   Not on file  Social History Narrative   Not on file   Social Determinants of Health   Financial Resource Strain: Low Risk  (01/13/2023)   Received from [facility], [facility]   Overall Financial Resource Strain (CARDIA)    Difficulty of Paying Living Expenses: Not hard at all  Food Insecurity: No Food Insecurity (01/13/2023)   Received from [facility], [facility]   Hunger Vital Sign    Worried About Running Out of Food in the Last Year: Never true    Ran Out of Food in the Last Year: Never true  Transportation Needs: No Transportation Needs (01/13/2023)   Received from [facility], [facility]   PRAPARE - Transportation    Lack of Transportation (Medical): No    Lack of Transportation (Non-Medical): No  Physical Activity: Sufficiently Active (01/13/2023)   Received from [facility], [facility]   Exercise Vital Sign    Days of Exercise per Week: 4 days    Minutes of Exercise per Session: 40 min  Stress: Stress Concern Present (01/13/2023)   Received from [facility], [facility] Occupational [facility] - Occupational Stress Questionnaire    Feeling of Stress : To some extent  Social Connections: Moderately Integrated (01/13/2023)   Received from [facility], [facility]   Social Network    How would you rate your social  network (family, work, friends)?: Adequate participation with social networks  Intimate Partner Violence: Not At Risk (01/13/2023)   Received from [facility], [facility]   HITS    Over the last 12 months how often did your partner physically hurt you?: 1    Over the last 12 months how often did your partner insult you or talk down to you?: 1    Over the last 12 months how often did your partner threaten you with physical harm?: 1    Over the last 12 months how often  did your partner scream or curse at you?: 1    FAMILY HISTORY: Family History  Problem Relation Age of Onset   Hypertension Mother    Crohn's disease Mother    Hypercholesterolemia Father    Hypertension Father    Heart disease Father    Diabetes Father    Melanoma Father    Esophageal cancer Father    Colon cancer Maternal Uncle    Breast cancer Paternal Aunt    Uterine cancer Maternal Grandmother    Breast cancer Maternal Grandmother    Lung cancer Maternal Grandmother    Heart disease Maternal Grandfather    Breast cancer Paternal Grandmother    Osteoporosis Paternal Grandmother    CVA Paternal Grandfather    Diabetes Paternal Grandfather    Dementia Paternal Grandfather    Rectal cancer Neg Hx    Stomach cancer Neg Hx     ALLERGIES:  is allergic to miconazole and sulfa antibiotics.  MEDICATIONS:  Current Outpatient Medications  Medication Sig Dispense Refill   mesalamine (CANASA) 1000 MG suppository PLACE 1 SUPPOSITORY (1,000 MG TOTAL) RECTALLY AT BEDTIME. 90 suppository 1   spironolactone (ALDACTONE) 50 MG tablet Take 50 mg by mouth 2 (two) times daily.     traZODone (DESYREL) 50 MG tablet Take 50 mg by mouth at bedtime as needed.     tretinoin (RETIN-A) 0.1 % cream Apply 1 application topically at bedtime.     valACYclovir (VALTREX) 500 MG tablet Take 500 mg by mouth as needed.     No current facility-administered medications for this visit.   PHYSICAL EXAMINATION: ECOG PERFORMANCE STATUS: 0 -  Asymptomatic  Vitals:   08/29/23 1542  BP: 108/66  Pulse: 98  Resp: 16  Temp: 98.1 F (36.7 C)  SpO2: 100%     Filed Weights   08/29/23 1542  Weight: 156 lb 12.8 oz (71.1 kg)    GENERAL:alert, no distress and comfortable Breast: Bilateral breasts inspected and palpated.   She has saline implants.  No palpable masses or regional adenopathy Implants appear to be intact on exam. Lower extremities: no BLE edema.  LABORATORY DATA:  I have reviewed the data as listed Lab Results  Component Value Date   WBC 8.0 10/31/2022   HGB 13.6 10/31/2022   HCT 39.2 10/31/2022   MCV 93.9 10/31/2022   PLT 318.0 10/31/2022     Chemistry      Component Value Date/Time   NA 136 01/25/2023 1408   NA 133 (L) 03/14/2015 1127   K 3.9 01/25/2023 1408   K 4.2 03/14/2015 1127   CL 101 01/25/2023 1408   CL 101 03/14/2015 1127   CO2 25 01/25/2023 1408   CO2 25 03/14/2015 1127   BUN 17 01/25/2023 1408   BUN 16 03/14/2015 1127   CREATININE 1.08 01/25/2023 1408   CREATININE 0.91 03/14/2015 1127      Component Value Date/Time   CALCIUM 9.3 01/25/2023 1408   CALCIUM 8.7 (L) 03/14/2015 1127   ALKPHOS 83 03/14/2015 1127   AST 61 (H) 03/14/2015 1127   ALT 63 (H) 03/14/2015 1127   BILITOT 0.5 03/14/2015 1127     Mammogram 03/2021  IMPRESSION: There is no radiographic evidence malignancy on the right.   RECOMMENDATION: Bilateral screening mammogram at age 86, unless otherwise clinically indicated.   ACR BI-RADS CATEGORY 2 - Benign.  Letter Sent: Benign Exam.   The patient was informed of these results and recommendations at the time of the visit.   Electronically  Signed by: Dellie Catholic on 04/11/2021 3:28 PM August 2024  RADIOGRAPHIC STUDIES: I have personally reviewed the radiological images as listed and agreed with the findings in the report. No results found.  She will be due for mammogram in November, ordered All questions were answered. The patient knows to call the clinic  with any problems, questions or concerns. I spent 20 minutes in the care of this patient including H and P, review of records, counseling and coordination of care.    Rachel Moulds, MD 08/29/2023 4:51 PM

## 2023-08-29 NOTE — Assessment & Plan Note (Signed)
This is a very pleasant 37 yr old female patient with PMH significant for breast augmentation with saline implantation referred to high risk breast clinic for recommendations regarding her ATM heterozygous gene mutation.  It is estimated that approximately 3 percent of White people in the [place] are ATM heterozygotes I discussed with the patient that having ATM gene mutation leads to 2-3 times increased risk of breast cancer which translates into a 30% lifetime risk of breast cancer. In two large cohort studies, it was thought that ER positive breast cancer is common than ER negative breast cancer among ATM carriers. For carriers who have a family history of ovarian cancer, we discuss the potential risks and benefits of rrBSO. For ATM carriers with a family history of pancreatic cancer, pancreatic cancer screening is offered with annual MRCP or EUS.  PLAN No concern for breast cancer on exam today.  She will alternate between mammograms and MRIs and these have been ordered. She will follow-up with her PCP in about 6 months with a breast exam.  She will report back to high risk breast clinic in about a year or sooner as needed.  She will continue self breast exams. She will continue on his abound in the interim for weight loss and this is being monitored by her physician.  There has been no update in family's history since her last visit here.

## 2023-10-04 ENCOUNTER — Ambulatory Visit
Admission: RE | Admit: 2023-10-04 | Discharge: 2023-10-04 | Disposition: A | Discharge status: Home / Self Care | Source: Ambulatory Visit | Attending: Hematology and Oncology | Admitting: Hematology and Oncology

## 2023-10-04 DIAGNOSIS — Z1509 Genetic susceptibility to other malignant neoplasm: Secondary | ICD-10-CM

## 2023-11-07 ENCOUNTER — Encounter: Payer: Self-pay | Admitting: Gastroenterology

## 2023-11-07 ENCOUNTER — Other Ambulatory Visit: Payer: Self-pay

## 2023-11-07 ENCOUNTER — Ambulatory Visit: Admitting: Gastroenterology

## 2023-11-07 VITALS — BP 118/80 | HR 83 | Ht 65.0 in | Wt 147.0 lb

## 2023-11-07 DIAGNOSIS — K51211 Ulcerative (chronic) proctitis with rectal bleeding: Secondary | ICD-10-CM

## 2023-11-07 MED ORDER — MESALAMINE 1.2 G PO TBEC
4.8000 g | DELAYED_RELEASE_TABLET | Freq: Every day | ORAL | 1 refills | Status: DC
Start: 2023-11-07 — End: 2024-11-07

## 2023-11-07 NOTE — Patient Instructions (Addendum)
We have sent the following medications to your pharmacy for you to pick up at your convenience:  Lialda 1.2 g: Take 4 tablets daily  Continue Canasa daily at bedtime  Please go to the lab in the basement of our building to have stool lab work done as you leave today. Hit "B" for basement when you get on the elevator.  When the doors open the lab is on your left.  We will call you with the results. Thank you.  You will be due for BMET lab in 3 to 4 weeks.  Please go to the lab the week of December 9th. Our lab is located in the basement of our building, located at [address]. They are open Monday through Friday from 7:30 am to 5:00 pm. You do not need an appointment.   Please see the [email or web address] website for additional information  You can purchase Curcumin over-the-counter and take 3 grams daily if desired.  Thank you for entrusting me with your care and for choosing [facility], Dr. Ileene Patrick     If your blood pressure at your visit was 140/90 or greater, please contact your primary care physician to follow up on this. ______________________________________________________  If you are age 1 or older, your body mass index should be between 23-30. Your Body mass index is 24.46 kg/m. If this is out of the aforementioned range listed, please consider follow up with your Primary Care Provider.  If you are age 53 or younger, your body mass index should be between 19-25. Your Body mass index is 24.46 kg/m. If this is out of the aformentioned range listed, please consider follow up with your Primary Care Provider.  ________________________________________________________  The [facility] providers would like to encourage you to use [facility] to communicate with providers for non-urgent requests or questions.  Due to long hold times on the telephone, sending your provider a message by [facility] may be a faster and more efficient way to get a response.  Please allow 48 business hours  for a response.  Please remember that this is for non-urgent requests.  _______________________________________________________  Due to recent changes in healthcare laws, you may see the results of your imaging and laboratory studies on MyChart before your provider has had a chance to review them.  We understand that in some cases there may be results that are confusing or concerning to you. Not all laboratory results come back in the same time frame and the provider may be waiting for multiple results in order to interpret others.  Please give [place] 48 hours in order for your provider to thoroughly review all the results before contacting the office for clarification of your results.

## 2023-11-07 NOTE — Progress Notes (Signed)
HPI :  37 year old female heterozygous for ATM gene mutation, here for follow up visit for colitis  IBD history:  Seen November 2023 for loose stools and bright red blood per rectum for a few months.  Her mother has Crohn's disease.  She underwent colonoscopy with me on December 2023 showing a few diminutive erosions in her ileum and inflammation of the distal 2 cm of her rectum.  Incidentally had 2 small polyps removed which were sessile serrated.  Pathology from her colonoscopy as outlined below.  Nonspecific mild ileitis, and chronic active inflammation of her colon. Treated with Canasa suppositories initially   SINCE LAST VISIT  I last saw her this past February.  At that time she was doing pretty well on Canasa monotherapy we continued the regimen.  Fecal calprotectin was in the 70s.  She states she was taking Tirzapetide over the summer for weight loss, she developed some symptoms such as dizziness and shortness of breath on it and eventually stopped it due to intolerance although she did lose some weight on the regimen.  She states about 1 month after she stopped it however she has had a change in her bowels, essentially for the past month or so.  She has had recurrence of loose stools going anywhere from 5-10 times per day.  She has some blood mixed in with most stools and some mucus.  She has urgency and tenesmus.  She has had some abdominal cramps over the past 2 weeks as well as increased gas and bloating.  She has been using Canasa throughout this time, states is not working as well as it used to.  Denies any sick contacts.  No recent antibiotic use.  She is not using any NSAIDs.  She has not not been on any other therapies other than Canasa so far.  In review of labs she had a CBC about a month ago showing normal hemoglobin.  She notes she has no family history of pancreatic cancer. She has had a few CT scans over the past few years for her renal stones.  She has had a CT scan in April  2015, March 2016, August 2020 which all showed a normal-appearing pancreas.   Colonoscopy 11/20/22: - A few diminutive erosions in the terminal ileum - nonspecific. Biopsied. - One 6 mm polyp in the ascending colon, removed with a cold snare. Resected and retrieved. - One 4 mm polyp in the transverse colon, removed with a cold snare. Resected and retrieved. - Localized mild inflammation was found in the distal 2cm of the rectum. Biopsied. - The examination was otherwise normal.                             1. Surgical [P], small bowel, ileum - MILDLY ACTIVE NONSPECIFIC ILEITIS - NEGATIVE FOR DEFINITE FEATURES OF CHRONICITY OR GRANULOMAS 2. Surgical [P], colon, ascending, transverse, polyp (2) - SESSILE SERRATED POLYP(S) WITHOUT CYTOLOGIC DYSPLASIA 3. Surgical [P], colon, rectum - MODERATELY ACTIVE CHRONIC PROCTITIS, SEE COMMENT - NEGATIVE FOR GRANULOMAS OR DYSPLASIA   3. The biopsies show mild architectural distortion, full-thickness lamina propria and basal lymphoplasmacytosis and cryptitis. 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 Clinical and radiologic correlation is suggested      Fecal calprotectin 01/30/23 - 72    Past Medical History:  Diagnosis Date   Abnormal Pap smear of cervix    LEEP    Anxiety    Ataxia  telangiectasia (ATM) (HCC)    Carrier of genetic disorder    high risk cancer mutation gene - high risk for breast and pancreatic cancer   Depression    History of kidney stones    IBD (inflammatory bowel disease)    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     Past Surgical History:  Procedure Laterality Date   AUGMENTATION MAMMAPLASTY Bilateral 07/2010   CERVICAL BIOPSY  W/ LOOP ELECTRODE EXCISION  2014   PLACEMENT OF BREAST IMPLANTS  07/2010   TUBAL LIGATION  08/14/2022   WISDOM TOOTH EXTRACTION     Family History  Problem Relation Age of Onset   Hypertension Mother    Crohn's disease Mother    Hypercholesterolemia Father     Hypertension Father    Heart disease Father    Diabetes Father    Melanoma Father    Esophageal cancer Father    Colon cancer Maternal Uncle    Breast cancer Paternal Aunt    Uterine cancer Maternal Grandmother    Breast cancer Maternal Grandmother    Lung cancer Maternal Grandmother    Heart disease Maternal Grandfather    Breast cancer Paternal Grandmother    Osteoporosis Paternal Grandmother    CVA Paternal Grandfather    Diabetes Paternal Grandfather    Dementia Paternal Grandfather    Rectal cancer Neg Hx    Stomach cancer Neg Hx    Social History   Tobacco Use   Smoking status: Never   Smokeless tobacco: Never  Vaping Use   Vaping status: Never Used  Substance Use Topics   Alcohol use: Yes    Comment: 1 per week   Drug use: Never   Current Outpatient Medications  Medication Sig Dispense Refill   mesalamine (CANASA) 1000 MG suppository PLACE 1 SUPPOSITORY (1,000 MG TOTAL) RECTALLY AT BEDTIME. 90 suppository 1   mesalamine (LIALDA) 1.2 g EC tablet Take 4 tablets (4.8 g total) by mouth daily with breakfast. 120 tablet 1   phentermine (ADIPEX-P) 37.5 MG tablet Take by mouth.     traZODone (DESYREL) 50 MG tablet Take 50 mg by mouth at bedtime as needed.     tretinoin (RETIN-A) 0.1 % cream Apply 1 application topically at bedtime.     valACYclovir (VALTREX) 500 MG tablet Take 500 mg by mouth as needed.     zolpidem (AMBIEN) 10 MG tablet Take 10 mg by mouth at bedtime as needed for sleep.     No current facility-administered medications for this visit.   Allergies  Allergen Reactions   Miconazole Rash    Labial swelling and burning   Sulfa Antibiotics Nausea And Vomiting     Review of Systems: All systems reviewed and negative except where noted in HPI.   Lab Results  Component Value Date   WBC 8.0 10/31/2022   HGB 13.6 10/31/2022   HCT 39.2 10/31/2022   MCV 93.9 10/31/2022   PLT 318.0 10/31/2022    Lab Results  Component Value Date   NA 136  01/25/2023   CL 101 01/25/2023   K 3.9 01/25/2023   CO2 25 01/25/2023   BUN 17 01/25/2023   CREATININE 1.08 01/25/2023   GFR 66.16 01/25/2023   CALCIUM 9.3 01/25/2023   ALBUMIN 4.2 03/14/2015   GLUCOSE 92 01/25/2023    Physical Exam: BP 118/80   Pulse 83   Ht 5\' 5"  (1.651 m)   Wt 147 lb (66.7 kg)   BMI 24.46 kg/m  Constitutional: Pleasant,well-developed, female in no acute  distress. Neurological: Alert and oriented to person place and time. Psychiatric: Normal mood and affect. Behavior is normal.   ASSESSMENT: 37 y.o. 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  Suspect this is most likely UC proctitis however possible she could have Crohn's disease given very mild nonspecific inflammation in her small intestine for which biopsies were nondiagnostic.  Initially we treated with Canasa suppositories and she did quite well.  Unfortunately for the past month sounds like she is clinically flaring and doing poorly on the regimen.  We discussed options.  Will have her go to the lab for fecal calprotectin and C. difficile PCR to make sure no infection, assess how much inflammation she is having.  Question if this is entirely proctitis causing the symptoms or if she has had any progression to an have additional colonic involvement.  We discussed biologic therapy for the treatment of IBD, she is hoping to avoid that if possible.  Will add oral mesalamine to see if this helps at all, starting with 4 tabs of Lialda daily over the next few weeks to see if that helps.  She can continue Canasa.  She can add curcumin if she would like, 2 to 3 g/day, which has shown benefit when used in conjunction with mesalamine, if she wants to try to avoid Biologics if at all possible.  We will see how she does with change in her regimen.  I asked her to contact me within the next week or so for an update.  If she is no better with  this, may add some steroids and transition to biologic.  She has just lost weight, we will hope to avoid prednisone if possible.  Would try Uceris foam or hydrocortisone enema initially if steroids are needed.  Will await her course to change in regimen.  Plan on repeating a BMET in 3 to 4 weeks to make sure stable renal function on Lialda.  I told her to contact me again if she is not feeling any better in the upcoming days, low threshold to add steroids.  Otherwise provided her some information and resources to read about IBD, recommended CCFA website.   PLAN: - stool for fecal calprotectin and C Diff PCR - start Lialda 4 tabs / day trial  - continue Canasa - can add Curcumin supplement if she wants to it in addition to oral mesalamine - 3gm / day - BMET in 3-4 weeks - consideration for biologic therapy if she does not respond - may need to bridge with Uceris / foam if no better, she wants to avoid prednisone if possible - look at the [email or web address] for patient information on IBD - contact me in one week with an update.  Harlin Rain, MD [facility]

## 2023-11-08 ENCOUNTER — Ambulatory Visit

## 2023-11-08 DIAGNOSIS — K51211 Ulcerative (chronic) proctitis with rectal bleeding: Secondary | ICD-10-CM

## 2023-11-09 LAB — CLOSTRIDIUM DIFFICILE TOXIN B, QUALITATIVE, REAL-TIME PCR: Toxigenic C. Difficile by PCR: NOT DETECTED

## 2023-11-11 LAB — CALPROTECTIN, FECAL: Calprotectin, Fecal: 724 ug/g — ABNORMAL HIGH (ref 0–120)

## 2023-11-12 ENCOUNTER — Encounter: Payer: Self-pay | Admitting: Gastroenterology

## 2023-11-12 MED ORDER — BUDESONIDE 2 MG/ACT RE FOAM: RECTAL | 0 refills | Status: DC

## 2024-03-06 ENCOUNTER — Encounter: Payer: Self-pay | Admitting: Gastroenterology

## 2024-03-11 ENCOUNTER — Encounter: Payer: Self-pay | Admitting: Gastroenterology

## 2024-03-11 ENCOUNTER — Ambulatory Visit (INDEPENDENT_AMBULATORY_CARE_PROVIDER_SITE_OTHER): Admitting: Gastroenterology

## 2024-03-11 ENCOUNTER — Telehealth: Payer: Self-pay | Admitting: Pharmacy Technician

## 2024-03-11 VITALS — BP 100/76 | HR 72 | Ht 65.0 in | Wt 141.4 lb

## 2024-03-11 DIAGNOSIS — K51211 Ulcerative (chronic) proctitis with rectal bleeding: Secondary | ICD-10-CM | POA: Diagnosis not present

## 2024-03-11 MED ORDER — BUDESONIDE 2 MG/ACT RE FOAM: RECTAL | 1 refills | Status: DC

## 2024-03-11 NOTE — Patient Instructions (Signed)
 We have sent the following medications to your pharmacy for you to pick up at your convenience: Uceris Foam: use per rectum daily for 4 weeks  Stop Lialda and Canasa.  You will be contacted regarding starting Entyvio.  Thank you for entrusting me with your care and for choosing [facility], Dr. Ileene Patrick   If your blood pressure at your visit was 140/90 or greater, please contact your primary care physician to follow up on this. ______________________________________________________  If you are age 11 or older, your body mass index should be between 23-30. Your Body mass index is 23.53 kg/m. If this is out of the aforementioned range listed, please consider follow up with your Primary Care Provider.  If you are age 14 or younger, your body mass index should be between 19-25. Your Body mass index is 23.53 kg/m. If this is out of the aformentioned range listed, please consider follow up with your Primary Care Provider.  ________________________________________________________  The [facility] providers would like to encourage you to use [facility] to communicate with providers for non-urgent requests or questions.  Due to long hold times on the telephone, sending your provider a message by [facility] may be a faster and more efficient way to get a response.  Please allow 48 business hours for a response.  Please remember that this is for non-urgent requests.  _______________________________________________________  Due to recent changes in healthcare laws, you may see the results of your imaging and laboratory studies on MyChart before your provider has had a chance to review them.  We understand that in some cases there may be results that are confusing or concerning to you. Not all laboratory results come back in the same time frame and the provider may be waiting for multiple results in order to interpret others.  Please give [place] 48 hours in order for your provider to thoroughly  review all the results before contacting the office for clarification of your results.

## 2024-03-11 NOTE — Progress Notes (Signed)
 HPI :  38 year old female heterozygous for ATM gene mutation, here for follow up visit for colitis   IBD history:  Seen November 2023 for loose stools and bright red blood per rectum for a few months.  Her mother has Crohn's disease.  She underwent colonoscopy with me on December 2023 showing a few diminutive erosions in her ileum and inflammation of the distal 2 cm of her rectum.  Incidentally had 2 small polyps removed which were sessile serrated.  Pathology from her colonoscopy as outlined below.  Nonspecific mild ileitis, and chronic active inflammation of her colon. Treated with Canasa suppositories initially   SINCE LAST VISIT   38 year old female here for follow-up visit for UC proctitis.  Recall I last saw her in November.  She had some active symptoms at the time, fecal calprotectin was elevated at 724 and C. difficile was negative.  We maximize Lialda 4 times daily in addition to [place] daily.  Also provided Uceris foam to help her with flare.  She states the foam has definitely helped and when she is on that it really reduces her symptoms however when she stops it her symptoms recur.  She feels that the oral Lialda in fact has been making her worse and she stopped it altogether.  She has been managing with Canasa once daily.  Despite that she states it does not work well for her, she is averaging she thinks upwards of 10 bowel movements per day where she passes blood in with urgency and mucus.  Stool is loose.  She recently had blood work with her primary care, see care everywhere for details, her hemoglobin is normal.  She feels this is really affecting her quality of life at this point and wants to pursue biologic therapy or escalate above mesalamine.  We had discussed potentially trying curcumin added to mesalamine but she never tried that.  She has been on a few courses of Uceris foam that she had at home and using it as needed and that does provide significant benefit when she takes  it.  We discussed options for her treatment options.  She prioritizes safety and minimizing side effects.  She is not opposed to infusion or injection therapies if needed.    Colonoscopy 11/20/22: - A few diminutive erosions in the terminal ileum - nonspecific. Biopsied. - One 6 mm polyp in the ascending colon, removed with a cold snare. Resected and retrieved. - One 4 mm polyp in the transverse colon, removed with a cold snare. Resected and retrieved. - Localized mild inflammation was found in the distal 2cm of the rectum. Biopsied. - The examination was otherwise normal.                             1. Surgical [P], small bowel, ileum - MILDLY ACTIVE NONSPECIFIC ILEITIS - NEGATIVE FOR DEFINITE FEATURES OF CHRONICITY OR GRANULOMAS 2. Surgical [P], colon, ascending, transverse, polyp (2) - SESSILE SERRATED POLYP(S) WITHOUT CYTOLOGIC DYSPLASIA 3. Surgical [P], colon, rectum - MODERATELY ACTIVE CHRONIC PROCTITIS, SEE COMMENT - NEGATIVE FOR GRANULOMAS OR DYSPLASIA   3. The biopsies show mild architectural distortion, full-thickness lamina propria and basal lymphoplasmacytosis and cryptitis. 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 Clinical and radiologic correlation is suggested    Fecal calprotectin 01/30/23 - 72    Fecal calprotectin 10/2023 - 724 C diff negative   Past Medical History:  Diagnosis Date   Abnormal Pap smear  of cervix    LEEP    Anxiety    Ataxia telangiectasia (ATM) (HCC)    Carrier of genetic disorder    high risk cancer mutation gene - high risk for breast and pancreatic cancer   Depression    Eczema    History of kidney stones    IBD (inflammatory bowel disease)    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     Past Surgical History:  Procedure Laterality Date   AUGMENTATION MAMMAPLASTY Bilateral 07/2010   CERVICAL BIOPSY  W/ LOOP ELECTRODE EXCISION  2014   PLACEMENT OF BREAST IMPLANTS  07/2010   TUBAL LIGATION  08/14/2022    WISDOM TOOTH EXTRACTION     Family History  Problem Relation Age of Onset   Hypertension Mother    Crohn's disease Mother    Hypercholesterolemia Father    Hypertension Father    Heart disease Father    Diabetes Father    Melanoma Father    Esophageal cancer Father    Colon cancer Maternal Uncle    Breast cancer Paternal Aunt    Uterine cancer Maternal Grandmother    Breast cancer Maternal Grandmother    Lung cancer Maternal Grandmother    Heart disease Maternal Grandfather    Breast cancer Paternal Grandmother    Osteoporosis Paternal Grandmother    CVA Paternal Grandfather    Diabetes Paternal Grandfather    Dementia Paternal Grandfather    Rectal cancer Neg Hx    Stomach cancer Neg Hx    Social History   Tobacco Use   Smoking status: Never   Smokeless tobacco: Never  Vaping Use   Vaping status: Never Used  Substance Use Topics   Alcohol use: Yes    Comment: 1 per week   Drug use: Never   Current Outpatient Medications  Medication Sig Dispense Refill   cephALEXin (KEFLEX) 500 MG capsule Take 500 mg by mouth every 6 (six) hours.     clindamycin (CLEOCIN T) 1 % SWAB APPLY SPARINGLY TO FACE IN THE MORNING AS DIRECTED     fluocinonide cream (LIDEX) 0.05 % Apply 1 Application topically as needed.     mesalamine (CANASA) 1000 MG suppository PLACE 1 SUPPOSITORY (1,000 MG TOTAL) RECTALLY AT BEDTIME. 90 suppository 1   mesalamine (LIALDA) 1.2 g EC tablet Take 4 tablets (4.8 g total) by mouth daily with breakfast. 120 tablet 1   methocarbamol (ROBAXIN) 500 MG tablet Take 500 mg by mouth as needed.     methocarbamol (ROBAXIN) 750 MG tablet Take 750 mg by mouth as needed.     Norethindrone-Ethinyl Estradiol-Fe Biphas (LO LOESTRIN FE) 1 MG-10 MCG / 10 MCG tablet Take 1 tablet by mouth daily.     spironolactone (ALDACTONE) 50 MG tablet Take 50 mg by mouth 2 (two) times daily.     tretinoin (RETIN-A) 0.1 % cream Apply 1 application topically at bedtime.     triamcinolone ointment  (KENALOG) 0.1 % Apply 1 Application topically as needed.     valACYclovir (VALTREX) 500 MG tablet Take 500 mg by mouth as needed.     zolpidem (AMBIEN) 10 MG tablet Take 10 mg by mouth at bedtime as needed for sleep.     Budesonide (UCERIS) 2 MG/ACT FOAM Insert 2 mg (1 metered dose) per rectum once daily x 4 weeks 33.4 g 1   traZODone (DESYREL) 50 MG tablet Take 50 mg by mouth at bedtime as needed. (Patient not taking: Reported on 03/11/2024)     No current  facility-administered medications for this visit.   Allergies  Allergen Reactions   Miconazole Rash    Labial swelling and burning   Sulfa Antibiotics Nausea And Vomiting     Review of Systems: All systems reviewed and negative except where noted in HPI.   Labs in care everywhere  Physical Exam: BP 100/76 (BP Location: Left Arm, Patient Position: Sitting, Cuff Size: Normal)   Pulse 72   Ht 5\' 5"  (1.651 m) Comment: height measured without shoes  Wt 141 lb 6 oz (64.1 kg)   BMI 23.53 kg/m  Constitutional: Pleasant,well-developed, female in no acute distress. Neurological: Alert and oriented to person place and time. Psychiatric: Normal mood and affect. Behavior is normal.   ASSESSMENT: 38 y.o. 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  She has failed mesalamine monotherapy with high-dose oral Lialda and Canasa.  She feels that Lialda causes paradoxical worsening and it is possible that could be the case.  She does reliably respond very well to Uceris foam with normalization of her symptoms.  This would indicate her active inflammation remains in the low rectum area, she is not having any anemia which is reassuring given the infrequency and bleeding she is having.  It is clear at this point that she has failed mesalamine and we discussed escalation of options  and how aggressive she want to be.  I reviewed all options for her for next steps to include anti-TNF, thiopurines, Entyvio, antiinterleukin options, other oral options to include Velcipity / Zeposia.  She is not a candidate for Rinvoq given she has not tried anti-TNF.  I discussed risks and benefits of each of these therapies.  She is prioritizing safety and minimizing her risks, in this light I think Entyvio might be her next best option.  We discussed this can be infusion versus injection therapy when she is up and running on it, I think she would prefer infusion followed by injection therapy.  We will run this by her insurance to see if approved and if so we will start with 3 infusions and then transition to injection therapy.  Following full discussion of this she agrees with this plan and wants to proceed.  At this point, I recommend she stop mesalamine as it is not working and potentially could be causing paradoxical worsening.  Recommend she go on Uceris foam 2 mg daily for 4 weeks to cover her until she can get up and running on Entyvio.  After a few treatments of Entyvio we will likely repeat fecal calprotectin.  I would like to see her back in the office in 3 to 4 months for reassessment.  She will call sooner if any issues  PLAN: - start Uceris foam 2mg  / day for 4 weeks, with refill if needed - stop Lialda and Canasa - start Entyvio if covered by insurance, will place request through infusion center - office visit in 3-4 months - fecal calprotectin to be done at follow up - call with questions or if not responding in the interim  Harlin Rain, MD [facility]

## 2024-03-11 NOTE — Telephone Encounter (Addendum)
 [place], Please submit auth for the [place] (pharmacy benefit)    Auth Submission: approved Site of care: Site of care: [facility] Payer: [employer] Medication & CPT/J Code(s) submitted: Entyvio (Vedolizumab) [id number] Route of submission (phone, fax, portal):  Phone #[phone] Fax # Auth type:  Units/visits requested: 300MG  Arthor Captain [id number] Reference number: [id number] Approval from: 03/06/24 - 03/06/25   Per Dellie Burns notes:  insurance requires documentation that the patient has: received induction dosing with two intravenous doses of vedolizumab (Entyvio) OR patient has been receiving intravenous vedolizumab (Entyvio) and achieved clinical response or remission beyond week 6.  Once two Doses have been received, we can provide documentation and submit for coverage for the pharmacy benefit (pens)

## 2024-03-17 ENCOUNTER — Encounter: Payer: Self-pay | Admitting: Gastroenterology

## 2024-03-19 ENCOUNTER — Telehealth: Payer: Self-pay

## 2024-03-19 ENCOUNTER — Encounter: Payer: Self-pay | Admitting: Gastroenterology

## 2024-03-19 ENCOUNTER — Encounter: Payer: Self-pay | Admitting: Hematology and Oncology

## 2024-03-19 ENCOUNTER — Other Ambulatory Visit (HOSPITAL_COMMUNITY): Payer: Self-pay

## 2024-03-19 NOTE — Telephone Encounter (Signed)
 I do not think the patient has tried any biologic therapy previously. It only looks like she has tried and failed mesalamine therapy so far.

## 2024-03-19 NOTE — Telephone Encounter (Signed)
 Pharmacy Patient Advocate Encounter   Received notification from Physician's Office that prior authorization for Entyvio Pen 108MG /0.68ML auto-injectors is required/requested.   Insurance verification completed.   The patient is insured through [employer] .   Per test claim: Prior Authorization form/request asks a question that requires your assistance. Please see the question below and advise accordingly. The PA will not be submitted until the necessary information is received.

## 2024-03-20 NOTE — Telephone Encounter (Signed)
 Pharmacy Patient Advocate Encounter  Received notification from [employer] that Prior Authorization for Entyvio Pen 108MG /0.68ML auto-injectors has been DENIED.  Full denial letter will be uploaded to the media tab. See denial reason below.  Coverage is provided in situations where the patient meets one of the following criteria: (1) the patient had inadequate response to Humira; (2) the patient had adverse reaction to Humira that is not expected to occur with the requested agent; (3) the patient has a contraindication to Humira; OR (4) the patient tried and failed or had an inadequate response to infliximab (Remicade).   PA #/Case ID/Reference #: [id number]

## 2024-03-20 NOTE — Telephone Encounter (Signed)
 Pharmacy Patient Advocate Encounter   Received notification from Physician's Office that prior authorization for  Entyvio Pen 108MG /0.68ML auto-injectors is required/requested.   Insurance verification completed.   The patient is insured through [employer] .   Per test claim: PA required; PA submitted to above mentioned insurance via CoverMyMeds Key/confirmation #/EOC [id number] Status is pending

## 2024-03-21 ENCOUNTER — Telehealth: Payer: Self-pay

## 2024-03-21 NOTE — Telephone Encounter (Signed)
 Dr Adela Lank-  Please advise. It seems insurance prefers patient to use Humira rather than Entyvio.

## 2024-03-21 NOTE — Telephone Encounter (Signed)
 Pharmacy Patient Advocate Encounter   Received notification from Physician's Office that prior authorization resubmission for Entyvio Pen 108MG /0.68ML auto-injectors is required/requested.   Insurance verification completed.   The patient is insured through [employer] .   Per test claim: PA required; PA submitted to above mentioned insurance via CoverMyMeds Key/confirmation #/EOC [id number] Status is pending

## 2024-03-21 NOTE — Telephone Encounter (Signed)
 Oh no, sorry to hear this. No would not recommend Humira for this patient with colitis, for which studies have shown Humira to be inferior to other options. How can we appeal this? Thanks

## 2024-03-24 NOTE — Telephone Encounter (Signed)
 Pharmacy Patient Advocate Encounter  Received notification from [employer] that Prior Authorization for Entyvio Pen 108MG /0.68ML auto-injectors has been DENIED.  Full denial letter will be uploaded to the media tab. See denial reason below.  Coverage is provided in situations where the patient has received induction dosing with two intravenous doses of vedolizumab (Entyvio) OR patient has been recevingin intravenous vedolizumab (Entyvio) and achieved clinical response ore remission beyond week 6.  PA #/Case ID/Reference #: [id number]

## 2024-03-24 NOTE — Telephone Encounter (Signed)
 A new submission was done on 4.4 and is currently pending

## 2024-03-25 ENCOUNTER — Telehealth: Payer: Self-pay

## 2024-03-25 ENCOUNTER — Telehealth: Payer: Self-pay | Admitting: Pharmacist

## 2024-03-25 NOTE — Telephone Encounter (Signed)
 OK, so since induction dosing was approved (see 03/11/24 infusion center note), are we good to go ahead and get patient scheduled for that induction therapy despite not having approval yet for maintenance?

## 2024-03-25 NOTE — Telephone Encounter (Signed)
 Spoke with patient and confirmed appointment for 03/26/24

## 2024-03-25 NOTE — Telephone Encounter (Signed)
 Amy Landry, We will wait till both are approved before we start the induction.  There have been cases when the induction was approved but not the maintenance, so to avoid that we typically wait till both are approved.  @[email or web address] , has the maintenance been approved yet???

## 2024-03-25 NOTE — Telephone Encounter (Signed)
 Insurance requires documentation that the patient has: received induction dosing with two intravenous doses of vedolizumab (Entyvio) OR patient has been receiving intravenous vedolizumab (Entyvio) and achieved clinical response or remission beyond week 6.  Once two Doses have been received, we can provide documentation and submit for coverage.

## 2024-03-25 NOTE — Telephone Encounter (Signed)
 Wanting to f/u on this??? Has it been approved yet??

## 2024-03-25 NOTE — Assessment & Plan Note (Signed)
 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

## 2024-03-25 NOTE — Progress Notes (Unsigned)
 [facility] CONSULT NOTE  Patient Care Team: Rebecka Apley, NP as PCP - General (Adult Health Nurse Practitioner)  CHIEF COMPLAINTS/PURPOSE OF CONSULTATION:  ATM heterozygous mutation  ASSESSMENT & PLAN:   No problem-specific Assessment & Plan notes found for this encounter.     No orders of the defined types were placed in this encounter.    HISTORY OF PRESENTING ILLNESS:   Amy Landry 38 y.o. female is here because of ATM mutation. Ms Calles is here with her mother for initial visit. She is a healthy 38 yr old with no significant PMH. She has bilateral saline implants. She had genetic testing because her brother's daughter had ataxia telangiectasia and died of leukemia.  Interval history  Patient is here for follow-up.      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    SURGICAL HISTORY:  Bilateral breast augmentation  SOCIAL HISTORY: Social History   Socioeconomic History   Marital status: Single    Spouse name: Not on file   Number of children: 0   Years of education: Not on file   Highest education level: Not on file  Occupational History   Occupation: Respiratory therapist  Tobacco Use   Smoking status: Never   Smokeless tobacco: Never  Vaping Use   Vaping status: Never Used  Substance and Sexual Activity   Alcohol use: Yes    Comment: 1 per week   Drug use: Never   Sexual activity: Not on file  Other Topics Concern   Not on file  Social History Narrative   Not on file   Social Drivers of Health   Financial Resource Strain: Low  Risk  (02/18/2024)   Received from [facility]   Overall Financial Resource Strain (CARDIA)    Difficulty of Paying Living Expenses: Not hard at all  Food Insecurity: No Food Insecurity (03/21/2024)   Received from [facility]   Hunger Vital Sign    Worried About Running Out of Food in the Last Year: Never true    Ran Out of Food in the Last Year: Never true  Transportation Needs: No Transportation Needs (03/21/2024)   Received from [facility] - Transportation    Lack of Transportation (Medical): No    Lack of Transportation (Non-Medical): No  Physical Activity: Sufficiently Active (02/18/2024)   Received [facility]   Exercise Vital Sign    Days of Exercise per Week: 5 days    Minutes of Exercise per Session: 60 min  Stress: No Stress Concern Present (03/21/2024)   Received from [facility] - Occupational Stress Questionnaire    Feeling of Stress : Not at all  Recent Concern: Stress - Stress Concern Present (02/18/2024)   Received from [facility] of Occupational Health - Occupational Stress Questionnaire    Feeling of Stress : To some extent  Social Connections: Socially Integrated (02/18/2024)   Received from [facility]   Social Network    How would you rate your social network (family, work, friends)?: Good participation with social networks  Intimate Partner Violence: Not At Risk (03/20/2024)   Received from [facility]   HITS    Over the last 12 months how often did your partner physically hurt you?: Never    Over the last 12 months how often did your partner insult you or talk down to you?: Never    Over the last 12 months how often did your partner threaten you with physical harm?: Never    Over the last 12 months how often did your partner scream or curse at you?: Never    FAMILY HISTORY: Family History  Problem Relation Age of Onset   Hypertension Mother    Crohn's disease Mother     Hypercholesterolemia Father    Hypertension Father    Heart disease Father    Diabetes Father    Melanoma Father    Esophageal cancer Father    Colon cancer Maternal Uncle    Breast cancer Paternal Aunt    Uterine cancer Maternal Grandmother    Breast cancer Maternal Grandmother    Lung cancer Maternal Grandmother    Heart disease Maternal Grandfather    Breast cancer Paternal Grandmother    Osteoporosis Paternal Grandmother    CVA Paternal Grandfather    Diabetes Paternal Grandfather    Dementia Paternal Grandfather    Rectal cancer Neg Hx    Stomach cancer Neg Hx     ALLERGIES:  is allergic to miconazole and sulfa antibiotics.  MEDICATIONS:  Current Outpatient Medications  Medication Sig Dispense Refill   Budesonide (UCERIS) 2 MG/ACT FOAM Insert 2 mg (1 metered dose) per rectum once daily x 4 weeks 33.4 g 1   cephALEXin (KEFLEX) 500 MG capsule Take 500 mg by mouth every 6 (six) hours.     clindamycin (CLEOCIN T) 1 % SWAB APPLY SPARINGLY TO FACE IN THE MORNING AS DIRECTED     fluocinonide cream (LIDEX) 0.05 % Apply 1 Application topically as needed.     mesalamine (CANASA) 1000 MG suppository PLACE 1 SUPPOSITORY (1,000 MG TOTAL) RECTALLY AT BEDTIME. 90 suppository 1   mesalamine (LIALDA) 1.2 g EC tablet Take 4 tablets (4.8 g total) by mouth daily with breakfast. 120 tablet 1   methocarbamol (ROBAXIN) 500 MG tablet Take 500 mg by mouth as needed.     methocarbamol (ROBAXIN) 750 MG tablet Take 750 mg by mouth as needed.     Norethindrone-Ethinyl Estradiol-Fe Biphas (LO LOESTRIN FE) 1 MG-10 MCG / 10 MCG tablet Take 1 tablet by mouth daily.     spironolactone (ALDACTONE) 50 MG tablet Take 50 mg by mouth 2 (two) times daily.     traZODone (DESYREL) 50 MG tablet Take 50 mg by mouth at bedtime as needed. (Patient not taking: Reported on 03/11/2024)     tretinoin (RETIN-A) 0.1 % cream Apply 1 application topically at bedtime.     triamcinolone ointment (KENALOG) 0.1 % Apply 1  Application topically as needed.     valACYclovir (VALTREX) 500 MG tablet Take 500 mg by mouth as needed.     zolpidem (AMBIEN) 10 MG tablet Take 10 mg by mouth at bedtime as needed for sleep.     No current facility-administered medications for this visit.   PHYSICAL EXAMINATION: ECOG PERFORMANCE STATUS: 0 - Asymptomatic  There were no vitals filed for this visit.    There were no vitals filed for  this visit.   GENERAL:alert, no distress and comfortable Breast: Bilateral breasts inspected and palpated.   She has saline implants.  No palpable masses or regional adenopathy Implants appear to be intact on exam. Lower extremities: no BLE edema.  LABORATORY DATA:  I have reviewed the data as listed Lab Results  Component Value Date   WBC 8.0 10/31/2022   HGB 13.6 10/31/2022   HCT 39.2 10/31/2022   MCV 93.9 10/31/2022   PLT 318.0 10/31/2022     Chemistry      Component Value Date/Time   NA 136 01/25/2023 1408   NA 133 (L) 03/14/2015 1127   K 3.9 01/25/2023 1408   K 4.2 03/14/2015 1127   CL 101 01/25/2023 1408   CL 101 03/14/2015 1127   CO2 25 01/25/2023 1408   CO2 25 03/14/2015 1127   BUN 17 01/25/2023 1408   BUN 16 03/14/2015 1127   CREATININE 1.08 01/25/2023 1408   CREATININE 0.91 03/14/2015 1127      Component Value Date/Time   CALCIUM 9.3 01/25/2023 1408   CALCIUM 8.7 (L) 03/14/2015 1127   ALKPHOS 83 03/14/2015 1127   AST 61 (H) 03/14/2015 1127   ALT 63 (H) 03/14/2015 1127   BILITOT 0.5 03/14/2015 1127     Mammogram 03/2021  IMPRESSION: There is no radiographic evidence malignancy on the right.   RECOMMENDATION: Bilateral screening mammogram at age 17, unless otherwise clinically indicated.   ACR BI-RADS CATEGORY 2 - Benign.  Letter Sent: Benign Exam.   The patient was informed of these results and recommendations at the time of the visit.   Electronically Signed by: Dellie Catholic on 04/11/2021 3:28 PM August 2024  RADIOGRAPHIC STUDIES: I have  personally reviewed the radiological images as listed and agreed with the findings in the report. No results found.  She will be due for mammogram in November, ordered All questions were answered. The patient knows to call the clinic with any problems, questions or concerns. I spent 20 minutes in the care of this patient including H and P, review of records, counseling and coordination of care.    Rachel Moulds, MD 03/25/2024 7:52 AM

## 2024-03-25 NOTE — Telephone Encounter (Signed)
 Kim, Please see the appeals note below. I do not think they will approve the maintenance dosing until patient has had at least 2 IV doses of entyvio.

## 2024-03-26 ENCOUNTER — Telehealth: Payer: Self-pay | Admitting: *Deleted

## 2024-03-26 ENCOUNTER — Ambulatory Visit (HOSPITAL_COMMUNITY)
Admission: RE | Admit: 2024-03-26 | Discharge: 2024-03-26 | Disposition: A | Discharge status: Home / Self Care | Source: Ambulatory Visit | Attending: Hematology and Oncology | Admitting: Hematology and Oncology

## 2024-03-26 ENCOUNTER — Inpatient Hospital Stay: Attending: Hematology and Oncology | Admitting: Hematology and Oncology

## 2024-03-26 VITALS — BP 127/88 | HR 95 | Temp 98.3°F | Resp 16 | Wt 145.6 lb

## 2024-03-26 DIAGNOSIS — Z1501 Genetic susceptibility to malignant neoplasm of breast: Secondary | ICD-10-CM | POA: Insufficient documentation

## 2024-03-26 DIAGNOSIS — Z1589 Genetic susceptibility to other disease: Secondary | ICD-10-CM | POA: Diagnosis not present

## 2024-03-26 DIAGNOSIS — I82412 Acute embolism and thrombosis of left femoral vein: Secondary | ICD-10-CM | POA: Insufficient documentation

## 2024-03-26 DIAGNOSIS — I2694 Multiple subsegmental pulmonary emboli without acute cor pulmonale: Secondary | ICD-10-CM

## 2024-03-26 DIAGNOSIS — Z7901 Long term (current) use of anticoagulants: Secondary | ICD-10-CM | POA: Insufficient documentation

## 2024-03-26 DIAGNOSIS — I82409 Acute embolism and thrombosis of unspecified deep veins of unspecified lower extremity: Secondary | ICD-10-CM | POA: Insufficient documentation

## 2024-03-26 DIAGNOSIS — Z1509 Genetic susceptibility to other malignant neoplasm: Secondary | ICD-10-CM | POA: Insufficient documentation

## 2024-03-26 DIAGNOSIS — I2699 Other pulmonary embolism without acute cor pulmonale: Secondary | ICD-10-CM | POA: Insufficient documentation

## 2024-03-26 MED ORDER — IOHEXOL 350 MG/ML SOLN
100.0000 mL | Freq: Once | INTRAVENOUS | Status: AC | PRN
  Administered 2024-03-26: 100 mL via INTRAVENOUS

## 2024-03-26 MED ORDER — SODIUM CHLORIDE (PF) 0.9 % IJ SOLN
INTRAMUSCULAR | Status: AC
  Filled 2024-03-26: qty 100

## 2024-03-26 NOTE — Telephone Encounter (Signed)
Please provide update

## 2024-03-26 NOTE — Telephone Encounter (Signed)
 Amy Landry, We will schedule the patient as soon as possible.  Once the patient has had her 2nd dose I will let you know.  Thanks for the update.  Amy Landry

## 2024-03-26 NOTE — Addendum Note (Signed)
 Addended by: Jackquline Denmark on: 03/26/2024 01:40 PM   Modules accepted: Orders

## 2024-03-26 NOTE — Telephone Encounter (Signed)
 Morrie Sheldon, Just wanted to keep you in the loop.  Please see above messages. Selena Batten

## 2024-03-26 NOTE — Telephone Encounter (Signed)
 This RN spoke with pt per need to proceed with CT for venous evaluation for possible treatments which has been scheduled for today at 530 pm.  Pt agreeable to above and with verbalized understanding of date,time and location.

## 2024-03-27 NOTE — Progress Notes (Unsigned)
 Patient ID: Amy Landry, female   DOB: 05-Mar-1986, 38 y.o.   MRN: [id number]  Reason for Consult: No chief complaint on file.   Referred by Rebecka Apley, NP  Subjective:     HPI  Amy Landry is a 38 y.o. female presenting for evaluation with a recent diagnosis of left leg DVT. She was recently diagnosed with a DVT in the left leg and multiple PEs.  Initial diagnosis was made last week and she was discharged on Eliquis.  She has seen a hematologist and this is thought to be provoked due to initiation of Lo Lestrin for PMS symptoms.  She presented to the emergency room few days later due to unbearable pain which was when she was found to have PEs although she is asymptomatic without shortness of breath or chest pain.  She was started on heparin 48 hours and transition to Lovenox.  Thrombectomy was not offered at [facility]. 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   Family History  Problem Relation Age of Onset   Hypertension Mother    Crohn's disease Mother    Hypercholesterolemia Father    Hypertension Father    Heart disease Father    Diabetes Father    Melanoma Father    Esophageal cancer Father    Colon cancer Maternal Uncle    Breast cancer Paternal Aunt    Uterine cancer Maternal Grandmother    Breast cancer Maternal Grandmother    Lung cancer Maternal Grandmother    Heart disease Maternal Grandfather    Breast cancer Paternal Grandmother    Osteoporosis Paternal Grandmother    CVA Paternal Grandfather    Diabetes Paternal Grandfather    Dementia Paternal Grandfather    Rectal cancer Neg Hx    Stomach cancer Neg Hx     Past Surgical History:  Procedure Laterality Date   AUGMENTATION MAMMAPLASTY Bilateral 07/2010   CERVICAL BIOPSY  W/ LOOP ELECTRODE EXCISION  2014   PLACEMENT OF BREAST IMPLANTS  07/2010   TUBAL LIGATION  08/14/2022   WISDOM TOOTH EXTRACTION      Short Social History:  Social History   Tobacco Use   Smoking status: Never   Smokeless tobacco: Never  Substance Use Topics   Alcohol use: Yes    Comment: 1 per week    Allergies  Allergen Reactions   Miconazole Rash    Labial swelling and burning   Sulfa Antibiotics Nausea And Vomiting    Current Outpatient Medications  Medication Sig Dispense Refill   Budesonide (UCERIS) 2 MG/ACT FOAM Insert 2 mg (1 metered dose) per rectum once daily x 4 weeks 33.4 g 1   cephALEXin (KEFLEX) 500 MG capsule Take 500 mg by mouth every 6 (six) hours.     clindamycin (CLEOCIN T) 1 % SWAB APPLY SPARINGLY TO FACE IN THE MORNING AS DIRECTED     fluocinonide cream (LIDEX) 0.05 % Apply 1 Application topically as needed.     mesalamine (CANASA) 1000 MG suppository PLACE 1 SUPPOSITORY (1,000 MG TOTAL) RECTALLY AT BEDTIME. 90  suppository 1   mesalamine (LIALDA) 1.2 g EC tablet Take 4 tablets (4.8 g total) by mouth daily with breakfast. 120 tablet 1   methocarbamol (ROBAXIN) 500 MG tablet Take 500 mg by mouth as needed.     methocarbamol (ROBAXIN) 750 MG tablet Take 750 mg by mouth as needed.     Norethindrone-Ethinyl Estradiol-Fe Biphas (LO LOESTRIN FE) 1 MG-10 MCG / 10 MCG tablet Take 1 tablet by mouth daily.     spironolactone (ALDACTONE) 50 MG tablet Take 50 mg by mouth 2 (two) times daily.     traZODone (DESYREL) 50 MG tablet Take 50 mg by mouth at bedtime as needed. (Patient not taking: Reported on 03/11/2024)     tretinoin (RETIN-A) 0.1 % cream Apply 1 application topically at bedtime.     triamcinolone ointment (KENALOG) 0.1 % Apply 1 Application topically as needed.     valACYclovir (VALTREX) 500 MG tablet Take 500 mg by mouth as needed.      zolpidem (AMBIEN) 10 MG tablet Take 10 mg by mouth at bedtime as needed for sleep.     No current facility-administered medications for this visit.    REVIEW OF SYSTEMS  Positive for ***  All other systems were reviewed and are negative     Objective:  Objective   There were no vitals filed for this visit. There is no height or weight on file to calculate BMI.  Physical Exam General: no acute distress Cardiac: hemodynamically stable Pulm: normal work of breathing Neuro: alert, no focal deficit Extremities: *** Vascular:   Right: Palpable DP  Left: Palpable DP   Data: CT independently reviewed Acute thrombosis of the femoral vein and popliteal vein. There is no extension proximally, the common femoral vein and profunda vein are spared.       Assessment/Plan:     Amy Landry is a 38 y.o. 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  She is currently being anticoagulated with Lovenox.  We discussed the indications for thrombectomy and I specifically explained that we typically do not offer a thrombectomy unless there is extension into the common femoral vein or iliac vein as there is not been shown to be benefit for surgical intervention versus anticoagulation for DVTs limited to the femoral and popliteal veins. Recommend continuing anticoagulation, duration of treatment can be decided by her hematologist. Follow-up as needed    Recommendations to optimize cardiovascular risk: Abstinence from all tobacco products. Blood glucose control with goal A1c < 7%. Blood pressure control with goal blood pressure < 140/90 mmHg. Lipid reduction therapy with goal LDL-C <100 mg/dL  Aspirin 81mg  PO QD.  Atorvastatin 40-80mg  PO QD (or other "high intensity" statin therapy).     Daria Pastures MD [facility]

## 2024-03-28 ENCOUNTER — Encounter: Payer: Self-pay | Admitting: Vascular Surgery

## 2024-03-28 ENCOUNTER — Ambulatory Visit (INDEPENDENT_AMBULATORY_CARE_PROVIDER_SITE_OTHER): Admitting: Vascular Surgery

## 2024-03-28 ENCOUNTER — Other Ambulatory Visit: Payer: Self-pay | Admitting: Hematology and Oncology

## 2024-03-28 VITALS — BP 128/85 | HR 96 | Temp 98.4°F | Resp 18 | Ht 65.0 in | Wt 145.5 lb

## 2024-03-28 DIAGNOSIS — I82412 Acute embolism and thrombosis of left femoral vein: Secondary | ICD-10-CM | POA: Diagnosis not present

## 2024-03-28 NOTE — Progress Notes (Signed)
 Tried calling pt to discuss CT results Requested a call back, left a brief voicemail.  Amy Landry

## 2024-04-01 NOTE — Telephone Encounter (Signed)
 F/U: Entyvio induction dose has been approved.  Scheduling team is aware to schedule patient as soon as possible.  I will f/u after the patient has had her second dose so you can begin the auth for the maintenance dosing.   Thanks Burdette Carolin

## 2024-04-01 NOTE — Telephone Encounter (Signed)
 Hello!  Any luck on getting this patient scheduled for initial Entyvio induction?

## 2024-04-07 ENCOUNTER — Telehealth: Payer: Self-pay | Admitting: *Deleted

## 2024-04-07 ENCOUNTER — Other Ambulatory Visit: Payer: Self-pay | Admitting: *Deleted

## 2024-04-07 ENCOUNTER — Encounter: Payer: Self-pay | Admitting: Hematology and Oncology

## 2024-04-07 MED ORDER — APIXABAN (ELIQUIS) VTE STARTER PACK (10MG AND 5MG)
ORAL_TABLET | ORAL | 0 refills | Status: DC
Start: 2024-04-07 — End: 2024-06-10

## 2024-04-09 ENCOUNTER — Inpatient Hospital Stay

## 2024-04-09 DIAGNOSIS — I82412 Acute embolism and thrombosis of left femoral vein: Secondary | ICD-10-CM | POA: Diagnosis not present

## 2024-04-09 DIAGNOSIS — Z1509 Genetic susceptibility to other malignant neoplasm: Secondary | ICD-10-CM

## 2024-04-09 DIAGNOSIS — Z1501 Genetic susceptibility to malignant neoplasm of breast: Secondary | ICD-10-CM | POA: Diagnosis not present

## 2024-04-09 DIAGNOSIS — Z7901 Long term (current) use of anticoagulants: Secondary | ICD-10-CM | POA: Diagnosis not present

## 2024-04-09 DIAGNOSIS — Z1589 Genetic susceptibility to other disease: Secondary | ICD-10-CM | POA: Diagnosis present

## 2024-04-09 LAB — ANTITHROMBIN III: AntiThromb III Func: 102 % (ref 75–120)

## 2024-04-10 ENCOUNTER — Ambulatory Visit (INDEPENDENT_AMBULATORY_CARE_PROVIDER_SITE_OTHER)

## 2024-04-10 VITALS — BP 115/78 | HR 87 | Temp 98.6°F | Resp 14 | Ht 65.0 in | Wt 142.2 lb

## 2024-04-10 DIAGNOSIS — K51211 Ulcerative (chronic) proctitis with rectal bleeding: Secondary | ICD-10-CM

## 2024-04-10 LAB — CARDIOLIPIN ANTIBODIES, IGG, IGM, IGA
Anticardiolipin IgA: <9 U/mL (ref 0–11)
Anticardiolipin IgG: <9 GPL U/mL (ref 0–14)
Anticardiolipin IgM: 10 [MPL'U]/mL (ref 0–12)

## 2024-04-10 LAB — BETA-2-GLYCOPROTEIN I ABS, IGG/M/A
Beta-2 Glyco I IgG: <9 GPI IgG units (ref 0–20)
Beta-2-Glycoprotein I IgA: <9 GPI IgA units (ref 0–25)
Beta-2-Glycoprotein I IgM: 15 GPI IgM units (ref 0–32)

## 2024-04-10 MED ORDER — VEDOLIZUMAB 300 MG IV SOLR
300.0000 mg | Freq: Once | INTRAVENOUS | Status: AC
  Administered 2024-04-10: 300 mg via INTRAVENOUS
  Filled 2024-04-10: qty 5

## 2024-04-10 NOTE — Patient Instructions (Signed)
 Vedolizumab Injection What is this medication? VEDOLIZUMAB (ve doe LIZ you mab) treats inflammatory bowel diseases. It works by decreasing inflammation in the digestive tract. It is a monoclonal antibody. This medicine may be used for other purposes; ask your health care provider or pharmacist if you have questions. COMMON BRAND NAME(S): Entyvio What should I tell my care team before I take this medication? They need to know if you have any of these conditions: Immune system problems Infection, such a tuberculosis (TB) or other bacterial, fungal, or viral infections Liver disease Recent or upcoming vaccine An unusual or allergic reaction to vedolizumab, other medications, foods, dyes, or preservatives Pregnant or trying to get pregnant Breastfeeding How should I use this medication? This medication is injected into a vein or under the skin. It is given by your care team in a hospital or clinic setting if it is injected into a vein. If it is injected under the skin, it may be given at home. If you get this medication at home, you will be taught how to prepare and give it. Use exactly as directed. Take it as directed on the prescription label. Keep taking it unless your care team tells you to stop. A special MedGuide will be given to you by the pharmacist with each prescription and refill. Be sure to read this information carefully each time. This medication comes with INSTRUCTIONS FOR USE. Ask your pharmacist for directions on how to use this medication. Read the information carefully. Talk to your pharmacist or care team if you have questions. Talk to your care team about the use of this medication in children. Special care may be needed. Overdosage: If you think you have taken too much of this medicine contact a poison control center or emergency room at once. NOTE: This medicine is only for you. Do not share this medicine with others. What if I miss a dose? If you get this medication at the  hospital or clinic: It is important not to miss your dose. Call your care team if you are unable to keep an appointment. If you give yourself this medication at home: If you miss a dose, take it as soon as you can. Then resume dosing every 2 weeks. Do not take double or extra doses. Call your care team with questions. What may interact with this medication? Live virus vaccines Natalizumab TNF blockers, such as adalimumab or infliximab This medication may affect how other medications work. Talk with your care team about all of the medications you take. They may suggest changes to your treatment plan to lower the risk of side effects and to make sure your medications work as intended. This list may not describe all possible interactions. Give your health care provider a list of all the medicines, herbs, non-prescription drugs, or dietary supplements you use. Also tell them if you smoke, drink alcohol, or use illegal drugs. Some items may interact with your medicine. What should I watch for while using this medication? Visit your care team for regular checks on your progress. Tell your care team if your symptoms do not start to get better or if they get worse. This medication may increase your risk of getting an infection. Call your care team for advice if you get a fever, chills, sore throat, or other symptoms of a cold or flu. Do not treat yourself. Try to avoid being around people who are sick. In some patients, this medication may cause a serious brain infection that may cause death. If  you have any problems seeing, thinking, speaking, walking, or standing, tell your care team right away. If you cannot reach your care team, urgently seek other source of medical care. What side effects may I notice from receiving this medication? Side effects that you should report to your care team as soon as possible: Allergic reactions--skin rash, itching, hives, swelling of the face, lips, tongue, or  throat Dizziness, loss of balance or coordination, confusion or trouble speaking Infection--fever, chills, cough, sore throat, wounds that don't heal, pain or trouble when passing urine, general feeling of discomfort or being unwell Infusion reactions--chest pain, shortness of breath or trouble breathing, feeling faint or lightheaded Liver injury--right upper belly pain, loss of appetite, nausea, light-colored stool, dark yellow or brown urine, yellowing skin or eyes, unusual weakness or fatigue Side effects that usually do not require medical attention (report these to your care team if they continue or are bothersome): Headache Fatigue Joint pain Nausea This list may not describe all possible side effects. Call your doctor for medical advice about side effects. You may report side effects to FDA at 1-800-FDA-1088. Where should I keep my medication? Keep out of the reach of children and pets. Store in a refrigerator or at room temperature between 20 and 25 degrees C (68 and 77 degrees F). Refrigeration (preferred): Store in the refrigerator. Do not freeze. Keep unopened prefilled syringes or pens in the original packaging to protect from light. Get rid of any unused medication after the expiration date. Room temperature: This medication may be stored at room temperature for up to 7 days. Keep unopened prefilled syringes or pens in the original packaging to protect from light. Get rid of any unused medication after 7 days, or after it expires, whichever is first. To get rid of medications that are no longer needed or have expired: Take the medication to a medication take-back program. Check with your pharmacy or law enforcement to find a location. If you cannot return the medication, ask your pharmacist or care team how to get rid of this medication safely. NOTE: This sheet is a summary. It may not cover all possible information. If you have questions about this medicine, talk to your doctor,  pharmacist, or health care provider.  2024 Elsevier/Gold Standard (2023-11-16 00:00:00)

## 2024-04-10 NOTE — Progress Notes (Signed)
 Diagnosis: Ulcerative proctitis with rectal bleeding   Provider:  Phyllis Breeze MD  Procedure: IV Infusion  IV Type: Peripheral, IV Location: L Antecubital  Entyvio  (Vedolizumab ), Dose: 300 mg  Infusion Start Time: 1459  Infusion Stop Time: 1533  Post Infusion IV Care: Observation period completed and Peripheral IV Discontinued  Discharge: Condition: Good, Destination: Home . AVS Declined  Performed by:  Lauran Pollard, LPN

## 2024-04-14 LAB — PROTHROMBIN GENE MUTATION

## 2024-04-15 ENCOUNTER — Encounter: Payer: Self-pay | Admitting: Gastroenterology

## 2024-04-15 NOTE — Telephone Encounter (Signed)
 No entry

## 2024-04-21 ENCOUNTER — Other Ambulatory Visit: Payer: Self-pay | Admitting: *Deleted

## 2024-04-21 ENCOUNTER — Telehealth: Payer: Self-pay

## 2024-04-21 NOTE — Telephone Encounter (Signed)
 Spoke with patient and confirmed appointment on 04/22/24

## 2024-04-22 ENCOUNTER — Other Ambulatory Visit

## 2024-04-22 ENCOUNTER — Inpatient Hospital Stay: Attending: Hematology and Oncology | Admitting: Hematology and Oncology

## 2024-04-22 VITALS — BP 112/70 | HR 74 | Temp 98.5°F | Resp 16 | Wt 142.5 lb

## 2024-04-22 DIAGNOSIS — Z8049 Family history of malignant neoplasm of other genital organs: Secondary | ICD-10-CM | POA: Insufficient documentation

## 2024-04-22 DIAGNOSIS — Z800 Family history of malignant neoplasm of digestive organs: Secondary | ICD-10-CM | POA: Insufficient documentation

## 2024-04-22 DIAGNOSIS — I82412 Acute embolism and thrombosis of left femoral vein: Secondary | ICD-10-CM | POA: Diagnosis not present

## 2024-04-22 DIAGNOSIS — Z803 Family history of malignant neoplasm of breast: Secondary | ICD-10-CM | POA: Diagnosis not present

## 2024-04-22 DIAGNOSIS — K5190 Ulcerative colitis, unspecified, without complications: Secondary | ICD-10-CM | POA: Insufficient documentation

## 2024-04-22 DIAGNOSIS — Z808 Family history of malignant neoplasm of other organs or systems: Secondary | ICD-10-CM | POA: Diagnosis not present

## 2024-04-22 DIAGNOSIS — Z1501 Genetic susceptibility to malignant neoplasm of breast: Secondary | ICD-10-CM | POA: Diagnosis not present

## 2024-04-22 DIAGNOSIS — Z1509 Genetic susceptibility to other malignant neoplasm: Secondary | ICD-10-CM

## 2024-04-22 DIAGNOSIS — Z801 Family history of malignant neoplasm of trachea, bronchus and lung: Secondary | ICD-10-CM | POA: Diagnosis not present

## 2024-04-22 DIAGNOSIS — Z7901 Long term (current) use of anticoagulants: Secondary | ICD-10-CM | POA: Diagnosis not present

## 2024-04-22 DIAGNOSIS — Z1589 Genetic susceptibility to other disease: Secondary | ICD-10-CM

## 2024-04-22 NOTE — Progress Notes (Signed)
 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 Symptoms improved with treatment. Continued anticoagulation recommended for at least 6 months to prevent recurrence. - No estrogen based contraception to be used - Continue anticoagulation therapy for at least six months,  Ulcerative colitis Ulcerative colitis managed with Entyvio . Symptoms improved significantly. Infusions every two weeks with potential transition to self-injection. - Continue Entyvio  infusions every two weeks per GI  Positive ANA test Positive ANA with homogeneous and speckled patterns suggests possible autoimmune disorder. Ulcerative colitis considered in differential diagnosis. Rheumatology evaluation recommended. She wanted to discuss with her GI specialist if this is needed. She will likely ask them to refer her to rheumatology based on their discussion.  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 She has her MRI scheduled No concerns on breast exam today RTC in 6 months.   No orders of the defined types were placed in this encounter.    HISTORY OF PRESENTING ILLNESS:   Amy Landry 38 y.o. female is here because of ATM mutation.   Discussed the use of AI scribe software for clinical note transcription with the patient, who gave verbal consent to proceed.  History of Present Illness  Amy Landry "Amy Landry" is a 38 year old female with a history of extensive DVT and multiple PEs who presents for follow-up. She tells me today if her DVT could be from GLP 1 inhibitor  Initially, she experienced symptoms she thought were muscle spasms  on her right side, later realizing they were related to clot formation. The first episode in January involved a sensation of pulling and snapping upon taking shallow breaths, confirmed by a CT scan to be an infarct on her right side. A second episode in March involved difficulty taking deep breaths and pleuritic pain, initially thought to be flu-like symptoms. A breathing treatment at work was ineffective.  She was on Zepbound, a GLP-1 medication, for weight loss for three months, resulting in a 40-pound loss. During this period, she experienced dizziness and shortness of breath during exercise. She was not on birth control during the second episode of symptoms.  She has ulcerative colitis, managed by Dr. General Kenner, and recently started on Entyvio , a monoclonal antibody, with significant symptom improvement. She has had her first infusion and plans to switch to self-injection after three successful infusions.  An extensive workup for clotting disorders, including tests for factor V Leiden, antiphospholipid antibodies, protein C and S antigens, and antithrombin III , was negative. However, she tested positive for ANA with homogeneous and speckled patterns, suggesting an autoimmune component, possibly related to her ulcerative colitis. She will discuss with GI doc if she will need a referral to rheumatology.  She reports improved symptoms with controlled pain and better breathing, although she still experiences some swelling in her leg. No current shortness of breath.  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    SURGICAL HISTORY:  Bilateral breast augmentation  SOCIAL HISTORY: Social History   Socioeconomic History   Marital status: Single    Spouse name: Not on file   Number of children: 0   Years of education: Not on file   Highest education level: Not on file  Occupational History   Occupation: Respiratory therapist  Tobacco Use   Smoking status: Never   Smokeless tobacco: Never  Vaping Use   Vaping status: Never Used  Substance and Sexual Activity   Alcohol use: Yes    Comment: 1 per week   Drug use: Never   Sexual activity: Not on file  Other Topics Concern   Not on file  Social History Narrative   Not on file   Social Drivers of Health   Financial Resource Strain: Low Risk  (02/18/2024)   Received from [facility]   Overall Financial Resource Strain (CARDIA)    Difficulty of Paying Living Expenses: Not hard at all  Food Insecurity: No Food Insecurity (03/21/2024)   Received from [facility]   Hunger Vital Sign    Worried About Running Out of Food in the Last Year: Never true    Ran Out of Food in the Last Year: Never true  Transportation Needs: No Transportation Needs (03/21/2024)   Received from [facility] - Transportation    Lack of Transportation (Medical): No    Lack of Transportation (Non-Medical): No  Physical Activity: Sufficiently Active (02/18/2024)   Received from [facility]   Exercise Vital Sign    Days of Exercise per Week: 5 days    Minutes of Exercise per Session: 60 min  Stress: No Stress Concern Present (03/21/2024)   Received from [facility] - Occupational Stress Questionnaire    Feeling of Stress : Not at all  Recent Concern: Stress - Stress Concern Present (02/18/2024)   Received from [facility] - Occupational Stress Questionnaire    Feeling of Stress : To some extent  Social Connections: Socially  Integrated (02/18/2024)   Received from [facility]   Social Network    How would you rate your social network (family, work, friends)?: Good participation with social networks  Intimate Partner Violence: Not At Risk (03/20/2024)   Received from [facility]   HITS    Over the last 12 months how often did your partner physically hurt you?: Never    Over the last 12 months how often did your partner insult you or talk down to you?: Never    Over the last 12 months how often did your partner threaten you with physical harm?: Never    Over the last 12 months how often did your partner scream or curse at you?: Never    FAMILY HISTORY: Family History  Problem Relation Age of Onset   Hypertension Mother    Crohn's disease Mother    Hypercholesterolemia Father    Hypertension Father    Heart disease Father    Diabetes Father    Melanoma Father    Esophageal cancer Father    Colon cancer Maternal Uncle    Breast cancer Paternal Aunt  Uterine cancer Maternal Grandmother    Breast cancer Maternal Grandmother    Lung cancer Maternal Grandmother    Heart disease Maternal Grandfather    Breast cancer Paternal Grandmother    Osteoporosis Paternal Grandmother    CVA Paternal Grandfather    Diabetes Paternal Grandfather    Dementia Paternal Grandfather    Rectal cancer Neg Hx    Stomach cancer Neg Hx     ALLERGIES:  is allergic to miconazole and sulfa antibiotics.  MEDICATIONS:  Current Outpatient Medications  Medication Sig Dispense Refill   APIXABAN  (ELIQUIS ) VTE STARTER PACK (10MG  AND 5MG ) Take as directed on package: start with two-5mg  tablets twice daily for 7 days. On day 8, switch to one-5mg  tablet twice daily. 74 each 0   Budesonide  (UCERIS ) 2 MG/ACT FOAM Insert 2 mg (1 metered dose) per rectum once daily x 4 weeks 33.4 g 1   cephALEXin (KEFLEX) 500 MG capsule Take 500 mg by mouth every 6 (six) hours.     clindamycin (CLEOCIN T) 1 % SWAB APPLY SPARINGLY TO FACE IN THE MORNING  AS DIRECTED     fluocinonide cream (LIDEX) 0.05 % Apply 1 Application topically as needed.     gabapentin (NEURONTIN) 100 MG capsule Take by mouth.     HYDROcodone-acetaminophen (NORCO/VICODIN) 5-325 MG tablet Take 1 tablet by mouth every 6 (six) hours as needed.     mesalamine  (CANASA ) 1000 MG suppository PLACE 1 SUPPOSITORY (1,000 MG TOTAL) RECTALLY AT BEDTIME. 90 suppository 1   mesalamine  (LIALDA ) 1.2 g EC tablet Take 4 tablets (4.8 g total) by mouth daily with breakfast. 120 tablet 1   methocarbamol (ROBAXIN) 500 MG tablet Take 500 mg by mouth as needed.     methocarbamol (ROBAXIN) 750 MG tablet Take 750 mg by mouth as needed.     Norethindrone-Ethinyl Estradiol-Fe Biphas (LO LOESTRIN FE) 1 MG-10 MCG / 10 MCG tablet Take 1 tablet by mouth daily.     spironolactone (ALDACTONE) 50 MG tablet Take 50 mg by mouth 2 (two) times daily.     traZODone (DESYREL) 50 MG tablet Take 50 mg by mouth at bedtime as needed.     tretinoin (RETIN-A) 0.1 % cream Apply 1 application topically at bedtime.     triamcinolone ointment (KENALOG) 0.1 % Apply 1 Application topically as needed.     valACYclovir (VALTREX) 500 MG tablet Take 500 mg by mouth as needed.     zolpidem (AMBIEN) 10 MG tablet Take 10 mg by mouth at bedtime as needed for sleep.     No current facility-administered medications for this visit.   PHYSICAL EXAMINATION: ECOG PERFORMANCE STATUS: 0 - Asymptomatic  Vitals:   04/22/24 1119  BP: 112/70  Pulse: 74  Resp: 16  Temp: 98.5 F (36.9 C)  SpO2: 100%      Filed Weights   04/22/24 1119  Weight: 142 lb 8 oz (64.6 kg)    GENERAL:alert, no distress and comfortable No cervical or axillary adenopathy LLE swelling greater than right, significantly improved. CTA bilaterally  LABORATORY DATA:  I have reviewed the data as listed Lab Results  Component Value Date   WBC 8.0 10/31/2022   HGB 13.6 10/31/2022   HCT 39.2 10/31/2022   MCV 93.9 10/31/2022   PLT 318.0 10/31/2022      Chemistry      Component Value Date/Time   NA 136 01/25/2023 1408   NA 133 (L) 03/14/2015 1127   K 3.9 01/25/2023 1408   K 4.2 03/14/2015  1127   CL 101 01/25/2023 1408   CL 101 03/14/2015 1127   CO2 25 01/25/2023 1408   CO2 25 03/14/2015 1127   BUN 17 01/25/2023 1408   BUN 16 03/14/2015 1127   CREATININE 1.08 01/25/2023 1408   CREATININE 0.91 03/14/2015 1127      Component Value Date/Time   CALCIUM 9.3 01/25/2023 1408   CALCIUM 8.7 (L) 03/14/2015 1127   ALKPHOS 83 03/14/2015 1127   AST 61 (H) 03/14/2015 1127   ALT 63 (H) 03/14/2015 1127   BILITOT 0.5 03/14/2015 1127     Mammogram 03/2021  IMPRESSION: There is no radiographic evidence malignancy on the right.   RECOMMENDATION: Bilateral screening mammogram at age 16, unless otherwise clinically indicated.   ACR BI-RADS CATEGORY 2 - Benign.  Letter Sent: Benign Exam.   The patient was informed of these results and recommendations at the time of the visit.   Electronically Signed by: Liston Riedel on 04/11/2021 3:28 PM August 2024   Reviewed recent records from [place].  RADIOGRAPHIC STUDIES: I have personally reviewed the radiological images as listed and agreed with the findings in the report. CT VENOGRAM ABD/PELVIS/LOWER EXT BILAT Result Date: 03/26/2024 CLINICAL DATA:  Acute left femoral DVT. EXAM: CT VENOGRAM ABDOMEN AND PELVIS AND LOWER EXTREMITY BILATERAL TECHNIQUE: Venographic phase images of the abdomen, pelvis and lower extremities were obtained following the administration of intravenous contrast. Multiplanar reformats and maximum intensity projections were generated. RADIATION DOSE REDUCTION: This exam was performed according to the departmental dose-optimization program which includes automated exposure control, adjustment of the mA and/or kV according to patient size and/or use of iterative reconstruction technique. CONTRAST:  OMNIPAQUE  IOHEXOL  350 MG/ML SOLN COMPARISON:  Abdominopelvic CT 03/14/2015, no  interval exams available. Report from CTA 03/20/2024 at an outside institution. Images are unavailable. FINDINGS: Lower chest: Subpleural 8 x 18 mm right lower lobe nodule series 4, image 37. Additional lateral right lower lobe nodule measuring 10 mm, series 4, image 26. Hepatobiliary: Question of mild hepatic steatosis. Peripheral area of decreased attenuation in the lower right hepatic lobe,, series 2, image 37. Gallbladder is decompressed. Pancreas: Unremarkable. No pancreatic ductal dilatation or surrounding inflammatory changes. Spleen: Normal in size without focal abnormality. Adrenals/Urinary Tract: No adrenal nodule. No hydronephrosis. Punctate densities within the left renal collecting system may represent excretion of IV contrast or punctate stones. No suspicious renal abnormality. Unremarkable urinary bladder. Stomach/Bowel: Stomach is within normal limits. Appendix appears normal. No evidence of bowel wall thickening, distention, or inflammatory changes. Moderate colonic stool burden. Scattered colonic diverticula. Vascular/Lymphatic: No abdominopelvic lymphadenopathy. Normal caliber abdominal aorta. Reproductive: Uterus and bilateral adnexa are unremarkable. Tubal ligation clips. Other: No ascites, free air or abdominopelvic collection. Mild subcutaneous edema in the left lower extremity. Musculoskeletal: There are no acute or suspicious osseous abnormalities. IVC: No evidence for thrombus or stenosis. Portal and mesenteric veins: No evidence for thrombus or stenosis. Bilateral iliac veins: No evidence for thrombus or stenosis. Right lower extremity: No evidence for thrombus involving the common femoral, femoral, popliteal veins. Left lower extremity: Filling defect in the superficial femoral vein extending from the proximal thigh distally. This extends through the popliteal vein. Calf veins are not well assessed on this exam. IMPRESSION: 1. Positive for acute DVT in the left superficial femoral vein  extending from the proximal thigh distally through the popliteal vein. This was described on imaging 6 days ago. 2. No DVT in the right lower extremity. No DVT in the IVC or iliac veins.  3. Two right lower lobe pulmonary nodules measuring up to 18 mm. This is indeterminate, but described on recent prior exam. Recommend CT follow-up, 3 months would be reasonable. 4. Peripheral area of decreased attenuation in the lower right hepatic lobe, nonspecific but may represent focal fatty infiltration. This can be further evaluated with nonemergent liver protocol MRI. 5. Punctate densities within the left renal collecting system may represent excretion of IV contrast or punctate stones. Electronically Signed   By: Chadwick Colonel M.D.   On: 03/26/2024 20:33    She will be due for mammogram in November, ordered All questions were answered. The patient knows to call the clinic with any problems, questions or concerns. I spent 30 minutes in the care of this patient including H and P, review of records, counseling and coordination of care.    Murleen Arms, MD 04/22/2024 11:28 AM

## 2024-04-23 ENCOUNTER — Encounter: Payer: Self-pay | Admitting: Gastroenterology

## 2024-04-24 ENCOUNTER — Telehealth: Admitting: Hematology and Oncology

## 2024-04-24 ENCOUNTER — Other Ambulatory Visit

## 2024-04-24 ENCOUNTER — Ambulatory Visit
Admission: RE | Admit: 2024-04-24 | Discharge: 2024-04-24 | Disposition: A | Discharge status: Home / Self Care | Source: Ambulatory Visit | Attending: Hematology and Oncology | Admitting: Hematology and Oncology

## 2024-04-24 ENCOUNTER — Ambulatory Visit

## 2024-04-24 VITALS — BP 115/76 | HR 72 | Temp 98.6°F | Resp 20 | Ht 65.0 in | Wt 143.0 lb

## 2024-04-24 DIAGNOSIS — K51211 Ulcerative (chronic) proctitis with rectal bleeding: Secondary | ICD-10-CM | POA: Diagnosis not present

## 2024-04-24 DIAGNOSIS — Z1501 Genetic susceptibility to malignant neoplasm of breast: Secondary | ICD-10-CM

## 2024-04-24 MED ORDER — VEDOLIZUMAB 300 MG IV SOLR
300.0000 mg | Freq: Once | INTRAVENOUS | Status: AC
  Administered 2024-04-24: 300 mg via INTRAVENOUS
  Filled 2024-04-24: qty 5

## 2024-04-24 MED ORDER — GADOPICLENOL 0.5 MMOL/ML IV SOLN
7.0000 mL | Freq: Once | INTRAVENOUS | Status: AC | PRN
  Administered 2024-04-24: 7 mL via INTRAVENOUS

## 2024-04-24 NOTE — Progress Notes (Signed)
 Diagnosis: Ulcerative Proctitis with rectal bleeding  Provider:  Phyllis Breeze MD  Procedure: IV Infusion  IV Type: Peripheral, IV Location: R Hand  Entyvio  (Vedolizumab ), Dose: 300 mg  Infusion Start Time: 1523  Infusion Stop Time: 1604  Post Infusion IV Care: Peripheral IV Discontinued  Discharge: Condition: Good, Destination: Home . AVS Provided and AVS Declined  Performed by:  Lendon George, RN

## 2024-04-29 NOTE — Telephone Encounter (Signed)
 PA team-  Any way we can retry for entyvio  108 mg auto injector every 2 weeks approval again now that the patient has had 2 entyvio  induction infusions?

## 2024-04-30 ENCOUNTER — Telehealth: Payer: Self-pay

## 2024-04-30 ENCOUNTER — Other Ambulatory Visit (HOSPITAL_COMMUNITY): Payer: Self-pay

## 2024-04-30 MED ORDER — ENTYVIO PEN 108 MG/0.68ML ~~LOC~~ SOAJ
1.0000 | SUBCUTANEOUS | 5 refills | Status: DC
  Filled 2024-07-03: qty 1.36, 28d supply, fill #0

## 2024-04-30 NOTE — Telephone Encounter (Signed)
 PA request has been Approved. New Encounter has been or will be created for follow up. For additional info see Pharmacy Prior Auth telephone encounter from 04/30/2024.

## 2024-04-30 NOTE — Telephone Encounter (Signed)
 Amy Landry, Nothing else is needed at this time. Her final IV dose is scheduled for 05/22/24, she then will transition to pens. Thanks Burdette Carolin

## 2024-04-30 NOTE — Addendum Note (Signed)
 Addended by: Glennette Lanius on: 04/30/2024 10:40 AM   Modules accepted: Orders

## 2024-04-30 NOTE — Telephone Encounter (Signed)
 Pharmacy Patient Advocate Encounter   Received notification from Pt Calls Messages that prior authorization for Entyvio  Pen 108MG /0.68ML auto-injectors is required/requested.   Insurance verification completed.   The patient is insured through [employer] .   Prior Authorization for Entyvio  Pen 108MG /0.68ML auto-injectors has been APPROVED from 03-31-2024 to 12-17-2098   PA #/Case ID/Reference #: [id number]

## 2024-05-13 ENCOUNTER — Encounter: Payer: Self-pay | Admitting: Hematology and Oncology

## 2024-05-14 ENCOUNTER — Other Ambulatory Visit: Payer: Self-pay

## 2024-05-21 ENCOUNTER — Ambulatory Visit: Admitting: Gastroenterology

## 2024-05-22 ENCOUNTER — Ambulatory Visit

## 2024-05-22 VITALS — BP 111/72 | HR 71 | Temp 98.5°F | Resp 18 | Ht 65.0 in | Wt 144.6 lb

## 2024-05-22 DIAGNOSIS — K51211 Ulcerative (chronic) proctitis with rectal bleeding: Secondary | ICD-10-CM

## 2024-05-22 MED ORDER — VEDOLIZUMAB 300 MG IV SOLR
300.0000 mg | Freq: Once | INTRAVENOUS | Status: AC
  Administered 2024-05-22: 300 mg via INTRAVENOUS
  Filled 2024-05-22: qty 5

## 2024-05-22 NOTE — Progress Notes (Signed)
 Diagnosis: Ulcerative Proctitis with rectal bleeding  Provider:  Phyllis Breeze MD  Procedure: IV Infusion  IV Type: Peripheral, IV Location: L Antecubital  Entyvio  (Vedolizumab ), Dose: 300 mg  Infusion Start Time: 1042  Infusion Stop Time: 1118  Post Infusion IV Care: Peripheral IV Discontinued  Discharge: Condition: Good, Destination: Home . AVS Declined  Performed by:  Laquetta Racey, RN

## 2024-06-10 ENCOUNTER — Other Ambulatory Visit: Payer: Self-pay | Admitting: *Deleted

## 2024-06-10 MED ORDER — APIXABAN 5 MG PO TABS: 5.0000 mg | ORAL_TABLET | Freq: Two times a day (BID) | ORAL | 6 refills | Status: DC

## 2024-06-11 ENCOUNTER — Ambulatory Visit (INDEPENDENT_AMBULATORY_CARE_PROVIDER_SITE_OTHER): Admitting: Gastroenterology

## 2024-06-11 ENCOUNTER — Other Ambulatory Visit (INDEPENDENT_AMBULATORY_CARE_PROVIDER_SITE_OTHER)

## 2024-06-11 ENCOUNTER — Ambulatory Visit: Payer: Self-pay | Admitting: Gastroenterology

## 2024-06-11 VITALS — BP 102/60 | Ht 65.0 in | Wt 145.4 lb

## 2024-06-11 DIAGNOSIS — K51211 Ulcerative (chronic) proctitis with rectal bleeding: Secondary | ICD-10-CM

## 2024-06-11 DIAGNOSIS — I82412 Acute embolism and thrombosis of left femoral vein: Secondary | ICD-10-CM | POA: Diagnosis not present

## 2024-06-11 DIAGNOSIS — R7401 Elevation of levels of liver transaminase levels: Secondary | ICD-10-CM

## 2024-06-11 DIAGNOSIS — R932 Abnormal findings on diagnostic imaging of liver and biliary tract: Secondary | ICD-10-CM

## 2024-06-11 LAB — HEPATIC FUNCTION PANEL
ALT: 19 U/L (ref 0–35)
AST: 20 U/L (ref 0–37)
Albumin: 4.3 g/dL (ref 3.5–5.2)
Alkaline Phosphatase: 56 U/L (ref 39–117)
Bilirubin, Direct: 0.2 mg/dL (ref 0.0–0.3)
Total Bilirubin: 0.7 mg/dL (ref 0.2–1.2)
Total Protein: 7.6 g/dL (ref 6.0–8.3)

## 2024-06-11 NOTE — Progress Notes (Signed)
 HPI :  38 year old female heterozygous for ATM gene mutation, here for follow up visit for colitis   IBD history:  Seen November 2023 for loose stools and bright red blood per rectum for a few months.  Her mother has Crohn's disease.  She underwent colonoscopy with me on December 2023 showing a few diminutive erosions in her ileum and inflammation of the distal 2 cm of her rectum.  Incidentally had 2 small polyps removed which were sessile serrated.  Pathology from her colonoscopy as outlined below.  Nonspecific mild ileitis, and chronic active inflammation of her colon. Treated with Canasa  suppositories initially. Failed Canasa , transitioned to Entyvio  April 2025.   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  Recall at her last visit she had some active symptoms, fecal calprotectin was elevated to 700s and C. difficile was negative.  We increased Lialda  in addition to Canasa , given Uceris  foam.  Lialda  she felt made her worse and we stopped it.  Ultimately she failed mesalamine  therapy and wanted to pursue biologic treatment.  After discussion of options we decided to pursue Entyvio .  She has started on the medication in April.  She tolerates the infusions without problems.  She states she feels so much better and her urgency of and bleeding have completely resolved.  She is having mostly normal bowel movements at this time, rare loose stool.  She is very happy with how she is doing with the Entyvio .  She has had a few infusions now and wonders if she can be switched to injection therapy.  Unfortunately she has had a difficult time with other issues since have seen her.  She was found to have a DVT extending into PE and treated now with anticoagulation.  She has been seen by hematology and she states it is unclear what exactly precipitated this.  The concern was that she started OCP a few days prior to this occurrence and could that be related.  She was also  on a GLP-1 agonist and had that stopped.  She was admitted to the hospital for 3 days for treatment.  She is now on Eliquis  and states she is doing pretty well in this regard and seems to be recovering.  She had a CT venogram per hematology on April 9.  She was found to have a peripheral area of decreased attenuation in the right hepatic lobe, not sure if it is focal fatty infiltration versus other and they recommended a nonemergent liver MRI to further evaluate.  She was unaware of this.  During her hospitalization she was noted to have mild ALT elevation to the 70s, AST was normal with normal T. bili and alk phos.    Prior workup: Colonoscopy 11/20/22: - A few diminutive erosions in the terminal ileum - nonspecific. Biopsied. - One 6 mm polyp in the ascending colon, removed with a cold snare. Resected and retrieved. - One 4 mm polyp in the transverse colon, removed with a cold snare. Resected and retrieved. - Localized mild inflammation was found in the distal 2cm of the rectum. Biopsied. - The examination was otherwise normal.                             1. Surgical [P], small bowel, ileum - MILDLY ACTIVE NONSPECIFIC ILEITIS - NEGATIVE FOR DEFINITE FEATURES OF CHRONICITY OR GRANULOMAS 2. Surgical [P], colon, ascending, transverse, polyp (2) - SESSILE SERRATED POLYP(S)  WITHOUT CYTOLOGIC DYSPLASIA 3. Surgical [P], colon, rectum - MODERATELY ACTIVE CHRONIC PROCTITIS, SEE COMMENT - NEGATIVE FOR GRANULOMAS OR DYSPLASIA   3. The biopsies show mild architectural distortion, full-thickness lamina propria and basal lymphoplasmacytosis and cryptitis. 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 Clinical and radiologic correlation is suggested     Fecal calprotectin 01/30/23 - 72    Fecal calprotectin 10/2023 - 724 C diff negative   Past Medical History:  Diagnosis Date   Abnormal Pap smear of cervix    LEEP    Anxiety    Ataxia telangiectasia (ATM)  (HCC)    Carrier of genetic disorder    high risk cancer mutation gene - high risk for breast and pancreatic cancer   Depression    DVT (deep venous thrombosis) (HCC)    Eczema    History of kidney stones    IBD (inflammatory bowel disease)    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     Past Surgical History:  Procedure Laterality Date   AUGMENTATION MAMMAPLASTY Bilateral 07/2010   CERVICAL BIOPSY  W/ LOOP ELECTRODE EXCISION  2014   PLACEMENT OF BREAST IMPLANTS  07/2010   TUBAL LIGATION  08/14/2022   WISDOM TOOTH EXTRACTION     Family History  Problem Relation Age of Onset   Hypertension Mother    Crohn's disease Mother    Hypercholesterolemia Father    Hypertension Father    Heart disease Father    Diabetes Father    Melanoma Father    Esophageal cancer Father    Colon cancer Maternal Uncle    Breast cancer Paternal Aunt    Uterine cancer Maternal Grandmother    Breast cancer Maternal Grandmother    Lung cancer Maternal Grandmother    Heart disease Maternal Grandfather    Breast cancer Paternal Grandmother    Osteoporosis Paternal Grandmother    CVA Paternal Grandfather    Diabetes Paternal Grandfather    Dementia Paternal Grandfather    Rectal cancer Neg Hx    Stomach cancer Neg Hx    Social History   Tobacco Use   Smoking status: Never   Smokeless tobacco: Never  Vaping Use   Vaping status: Never Used  Substance Use Topics   Alcohol use: Yes    Comment: 1 per week   Drug use: Never   Current Outpatient Medications  Medication Sig Dispense Refill   apixaban  (ELIQUIS ) 5 MG TABS tablet Take 1 tablet (5 mg total) by mouth 2 (two) times daily. 60 tablet 6   spironolactone (ALDACTONE) 50 MG tablet Take 50 mg by mouth 2 (two) times daily.     traZODone (DESYREL) 50 MG tablet Take 50 mg by mouth at bedtime as needed.     tretinoin (RETIN-A) 0.1 % cream Apply 1 application topically at bedtime.     valACYclovir (VALTREX) 500 MG tablet Take 500 mg by mouth as needed.      zolpidem (AMBIEN) 10 MG tablet Take 10 mg by mouth at bedtime as needed for sleep.     Budesonide  (UCERIS ) 2 MG/ACT FOAM Insert 2 mg (1 metered dose) per rectum once daily x 4 weeks (Patient not taking: Reported on 06/11/2024) 33.4 g 1   cephALEXin (KEFLEX) 500 MG capsule Take 500 mg by mouth every 6 (six) hours.     clindamycin (CLEOCIN T) 1 % SWAB APPLY SPARINGLY TO FACE IN THE MORNING AS DIRECTED     fluocinonide cream (LIDEX) 0.05 % Apply 1 Application topically as  needed.     gabapentin (NEURONTIN) 100 MG capsule Take by mouth. (Patient not taking: Reported on 06/11/2024)     HYDROcodone-acetaminophen (NORCO/VICODIN) 5-325 MG tablet Take 1 tablet by mouth every 6 (six) hours as needed. (Patient not taking: Reported on 06/11/2024)     mesalamine  (CANASA ) 1000 MG suppository PLACE 1 SUPPOSITORY (1,000 MG TOTAL) RECTALLY AT BEDTIME. (Patient not taking: Reported on 06/11/2024) 90 suppository 1   mesalamine  (LIALDA ) 1.2 g EC tablet Take 4 tablets (4.8 g total) by mouth daily with breakfast. (Patient not taking: Reported on 06/11/2024) 120 tablet 1   methocarbamol (ROBAXIN) 500 MG tablet Take 500 mg by mouth as needed. (Patient not taking: Reported on 06/11/2024)     methocarbamol (ROBAXIN) 750 MG tablet Take 750 mg by mouth as needed. (Patient not taking: Reported on 06/11/2024)     Norethindrone-Ethinyl Estradiol-Fe Biphas (LO LOESTRIN FE) 1 MG-10 MCG / 10 MCG tablet Take 1 tablet by mouth daily.     triamcinolone ointment (KENALOG) 0.1 % Apply 1 Application topically as needed. (Patient not taking: Reported on 06/11/2024)     Vedolizumab  (ENTYVIO  PEN) 108 MG/0.68ML SOAJ Inject 1 Pen into the skin every 14 (fourteen) days. 1.36 mL 5   No current facility-administered medications for this visit.   Allergies  Allergen Reactions   Miconazole Rash    Labial swelling and burning   Sulfa Antibiotics Nausea And Vomiting     Review of Systems: All systems reviewed and negative except where noted in HPI.    Labs reviewed in Epic  Physical Exam: BP 102/60   Ht 5' 5 (1.651 m)   Wt 145 lb 6.4 oz (66 kg)   BMI 24.20 kg/m  Constitutional: Pleasant,well-developed, female in no acute distress. Neurological: Alert and oriented to person place and time. Psychiatric: Normal mood and affect. Behavior is normal.   ASSESSMENT: 38 y.o. female here for assessment of the following  1. Ulcerative proctitis with rectal bleeding (HCC)   2. Elevated ALT measurement   3. Abnormal finding on imaging of liver   4. Acute deep vein thrombosis (DVT) of femoral vein of left lower extremity (HCC)    Overall has had a very nice response to Entyvio  as treatment for her ulcerative proctitis.  Bleeding and urgency has resolved and bowel habits now back to baseline.  She is tolerating the regimen quite well.  Discussed options, we will see if we can get her approved for injection therapy as opposed to infusion, for convenience.  We will reach out to her insurance to see if this will be covered and if so we will start therapy with plans for injection therapy every 2 weeks.  Moving forward I would like to repeat a fecal calprotectin in a few more months to make sure normalization on therapy.  Reviewed her recent course with her DVT and PE.  Obviously very concerning, perhaps related to OCP although she is also concerned about the relation to GLP-1 agonist and has stopped that.  Her colitis is very limited and she is very active, I would think that would be less likely to be related at the time of her clot.  Isolated ALT elevation during recent hospitalization.  Will repeat her LFTs and if they are persistently elevated will need additional serologic evaluation.  I reviewed her CT scan with her and recommend liver MRI to ensure okay.  She is agreeable with this.  Follow-up in 6 months or sooner with issues.   PLAN: - try to  switch entyvio  from IV to SQ - will see if insurance will cover - 108 mg once every 2 weeks  -  fecal calprotectin in 1-2 months - lab for LFTs, if persistent ALT elevation, then will need serologic workup - MRI liver - to evaluate abnormal CT scan - f/u 6 months   Marcey Naval, MD [facility] Gastroenter[facility]

## 2024-06-11 NOTE — Patient Instructions (Addendum)
 Please go to the lab in the basement of our building to have lab work done as you leave today. Hit B for basement when you get on the elevator.  When the doors open the lab is on your left.  We will call you with the results. Thank you.   You have been scheduled for an MRI at [facility], [place], 1st floor, Radiology. Your appointment is scheduled on Friday, 06-27-24 at 8:30 am. Please arrive 15 minutes prior to your appointment time for registration purposes. Please make certain not to have anything to eat or drink 4 hours prior to your test. In addition, if you have any metal in your body, have a pacemaker or defibrillator, please be sure to let your ordering physician know. This test typically takes 45 minutes to 1 hour to complete. Should you need to reschedule, please call [phone].   You will be due for a fecal calprotectin stool test in mid August.  Please return your stool sample to the lab in our basement  You will be contacted regarding switching your Entyvio  to a subcutaneous injection.  Please follow up in 6 months.  Thank you for entrusting me with your care and for choosing [facility], Dr. Elspeth Naval    If your blood pressure at your visit was 140/90 or greater, please contact your primary care physician to follow up on this. ______________________________________________________  If you are age 16 or older, your body mass index should be between 23-30. Your Body mass index is 24.2 kg/m. If this is out of the aforementioned range listed, please consider follow up with your Primary Care Provider.  If you are age 66 or younger, your body mass index should be between 19-25. Your Body mass index is 24.2 kg/m. If this is out of the aformentioned range listed, please consider follow up with your Primary Care Provider.  ________________________________________________________  The [facility] providers would like to encourage you to use MYCHART to communicate with  providers for non-urgent requests or questions.  Due to long hold times on the telephone, sending your provider a message by [facility] may be a faster and more efficient way to get a response.  Please allow 48 business hours for a response.  Please remember that this is for non-urgent requests.  _______________________________________________________  Due to recent changes in healthcare laws, you may see the results of your imaging and laboratory studies on MyChart before your provider has had a chance to review them.  We understand that in some cases there may be results that are confusing or concerning to you. Not all laboratory results come back in the same time frame and the provider may be waiting for multiple results in order to interpret others.  Please give us  48 hours in order for your provider to thoroughly review all the results before contacting the office for clarification of your results.

## 2024-06-16 ENCOUNTER — Other Ambulatory Visit (HOSPITAL_COMMUNITY): Payer: Self-pay

## 2024-06-16 ENCOUNTER — Telehealth: Payer: Self-pay

## 2024-06-16 NOTE — Telephone Encounter (Signed)
-----   Message from [place] sent at 06/12/2024 11:59 AM EDT ----- Regarding: RE: help with entyvio  change in formulation Naomie,  I have added Rosina for further assistance.  Rosina, can you arrange shipment of her Entyvio  SQ. ----- Message ----- From: Claudene Naomie SAILOR, RN Sent: 06/12/2024  10:45 AM EDT To: [place], CPhT Subject: FW: help with entyvio  change in formulation    Kim- See 04/30/24 telephone note. Can you help with this? I see that pens were already approved and it looks like a script was sent to pharmacy 04/30/24? ----- Message ----- From: Leigh Elspeth SQUIBB, MD Sent: 06/11/2024   6:10 PM EDT To: [facility] Triage Subject: help with entyvio  change in formulation        Hi -this patient is on Entyvio  infusions and wants to switch to injection therapies. I was wondering if we could check to see if her insurance would cover SQ Entyvio ?  Ideally would like to give her Entyvio  108 mg SQ once every 2 weeks if possible.  Thanks

## 2024-06-27 ENCOUNTER — Ambulatory Visit (HOSPITAL_COMMUNITY)
Admission: RE | Admit: 2024-06-27 | Discharge: 2024-06-27 | Disposition: A | Discharge status: Home / Self Care | Source: Ambulatory Visit | Attending: Gastroenterology

## 2024-06-27 ENCOUNTER — Ambulatory Visit (HOSPITAL_COMMUNITY)
Admission: RE | Admit: 2024-06-27 | Discharge: 2024-06-27 | Disposition: A | Discharge status: Home / Self Care | Source: Ambulatory Visit | Attending: Hematology and Oncology

## 2024-06-27 DIAGNOSIS — K51211 Ulcerative (chronic) proctitis with rectal bleeding: Secondary | ICD-10-CM | POA: Insufficient documentation

## 2024-06-27 DIAGNOSIS — R932 Abnormal findings on diagnostic imaging of liver and biliary tract: Secondary | ICD-10-CM

## 2024-06-27 DIAGNOSIS — R7401 Elevation of levels of liver transaminase levels: Secondary | ICD-10-CM | POA: Diagnosis present

## 2024-06-27 DIAGNOSIS — I82412 Acute embolism and thrombosis of left femoral vein: Secondary | ICD-10-CM | POA: Diagnosis present

## 2024-06-27 MED ORDER — GADOBUTROL 1 MMOL/ML IV SOLN
6.0000 mL | Freq: Once | INTRAVENOUS | Status: AC | PRN
  Administered 2024-06-27: 6 mL via INTRAVENOUS

## 2024-07-01 ENCOUNTER — Other Ambulatory Visit: Payer: Self-pay | Admitting: *Deleted

## 2024-07-01 ENCOUNTER — Inpatient Hospital Stay
Admission: RE | Admit: 2024-07-01 | Discharge: 2024-07-01 | Disposition: A | Discharge status: Home / Self Care | Payer: Self-pay | Source: Ambulatory Visit | Attending: Hematology and Oncology | Admitting: Hematology and Oncology

## 2024-07-01 ENCOUNTER — Telehealth: Payer: Self-pay | Admitting: *Deleted

## 2024-07-01 ENCOUNTER — Other Ambulatory Visit: Payer: Self-pay

## 2024-07-01 DIAGNOSIS — R911 Solitary pulmonary nodule: Secondary | ICD-10-CM

## 2024-07-01 DIAGNOSIS — I2694 Multiple subsegmental pulmonary emboli without acute cor pulmonale: Secondary | ICD-10-CM

## 2024-07-01 NOTE — Telephone Encounter (Signed)
 This RN obtained most recent CT per [facility] with noted lung nodules as prior done at [facility].  Comparison was not made.  This RN placed order for outside film request for comparison reading and called to [facility] radiology- spoke with Alan who pushed the 03/20/2024 CT angio into [facility] for [facility].  Note this RN called main number at [place] of [phone] and requested radiology.

## 2024-07-03 ENCOUNTER — Other Ambulatory Visit: Payer: Self-pay

## 2024-07-03 ENCOUNTER — Other Ambulatory Visit (HOSPITAL_COMMUNITY): Payer: Self-pay

## 2024-07-03 NOTE — Progress Notes (Signed)
 Specialty Pharmacy Initiation Note   Amy Landry is a 38 y.o. female who will be followed by the specialty pharmacy service for RxSp Ulcerative Colitis    Review of administration, indication, effectiveness, safety, potential side effects, storage/disposable, and missed dose instructions occurred today for patient's specialty medication(s) Vedolizumab  (Entyvio  Pen)     Patient/Caregiver did not have any additional questions or concerns.   Patient's therapy is appropriate to: Initiate    Goals Addressed             This Visit's Progress    Stabilization of disease       Patient is initiating therapy. Patient will maintain adherence        We will follow up with the patient every 12 months and she was encouraged to call if any questions or concerns arise.   Silvano LOISE Dolly Specialty Pharmacist

## 2024-07-03 NOTE — Progress Notes (Signed)
 Specialty Pharmacy Initial Fill Coordination Note  Amy Landry is a 38 y.o. female contacted today regarding initial fill of specialty medication(s) Vedolizumab  (Entyvio  Pen)   Patient requested Delivery   Delivery date: 07/24/24   Verified address: [address] [place], [place] [address]   Medication will be filled on 07-23-2024.   Patient is aware of $0.00 copayment.

## 2024-07-14 ENCOUNTER — Other Ambulatory Visit: Payer: Self-pay | Admitting: *Deleted

## 2024-07-14 ENCOUNTER — Encounter: Payer: Self-pay | Admitting: Hematology and Oncology

## 2024-07-14 MED ORDER — APIXABAN 5 MG PO TABS: 5.0000 mg | ORAL_TABLET | Freq: Two times a day (BID) | ORAL | 2 refills | Status: DC

## 2024-07-16 ENCOUNTER — Other Ambulatory Visit: Payer: Self-pay | Admitting: *Deleted

## 2024-07-16 ENCOUNTER — Other Ambulatory Visit (HOSPITAL_COMMUNITY): Payer: Self-pay

## 2024-07-17 ENCOUNTER — Ambulatory Visit

## 2024-07-17 ENCOUNTER — Other Ambulatory Visit: Payer: Self-pay

## 2024-07-17 VITALS — BP 113/75 | HR 69 | Temp 98.5°F | Resp 14 | Ht 65.0 in | Wt 143.0 lb

## 2024-07-17 DIAGNOSIS — K51211 Ulcerative (chronic) proctitis with rectal bleeding: Secondary | ICD-10-CM

## 2024-07-17 MED ORDER — VEDOLIZUMAB 300 MG IV SOLR
300.0000 mg | Freq: Once | INTRAVENOUS | Status: AC
  Administered 2024-07-17: 300 mg via INTRAVENOUS
  Filled 2024-07-17: qty 5

## 2024-07-17 NOTE — Progress Notes (Signed)
 Diagnosis: Ulcerative Proctitis  Provider:  Lonna Coder MD  Procedure: IV Infusion  IV Type: Peripheral, IV Location: L Antecubital  Entyvio  (Vedolizumab ), Dose: 300 mg  Infusion Start Time: 1016  Infusion Stop Time: 1049  Post Infusion IV Care: Peripheral IV Discontinued  Discharge: Condition: Good, Destination: Home . AVS Declined  Performed by:  Donny Childes, RN

## 2024-07-21 ENCOUNTER — Encounter: Payer: Self-pay | Admitting: Gastroenterology

## 2024-07-23 ENCOUNTER — Other Ambulatory Visit: Payer: Self-pay

## 2024-08-07 ENCOUNTER — Other Ambulatory Visit: Payer: Self-pay

## 2024-08-07 ENCOUNTER — Telehealth: Payer: Self-pay | Admitting: Gastroenterology

## 2024-08-07 NOTE — Telephone Encounter (Signed)
 Patient requesting f/u call to discuss scheduling apt to come in for self injection for  intivio ? Please advise.

## 2024-08-07 NOTE — Telephone Encounter (Signed)
 Pharmacy Patient Advocate Encounter  Patient signed up for Entyvio  Connect and they will be reaching out to patient in regards to a nurse education program

## 2024-08-13 ENCOUNTER — Other Ambulatory Visit: Payer: Self-pay

## 2024-08-13 ENCOUNTER — Other Ambulatory Visit (INDEPENDENT_AMBULATORY_CARE_PROVIDER_SITE_OTHER)

## 2024-08-13 ENCOUNTER — Encounter (INDEPENDENT_AMBULATORY_CARE_PROVIDER_SITE_OTHER): Payer: Self-pay

## 2024-08-13 DIAGNOSIS — K51211 Ulcerative (chronic) proctitis with rectal bleeding: Secondary | ICD-10-CM

## 2024-08-15 LAB — CALPROTECTIN, FECAL: Calprotectin, Fecal: 12 ug/g (ref 0–120)

## 2024-08-28 ENCOUNTER — Ambulatory Visit: Admitting: Hematology and Oncology

## 2024-09-10 ENCOUNTER — Other Ambulatory Visit: Payer: Self-pay

## 2024-09-10 MED ORDER — ENTYVIO PEN 108 MG/0.68ML ~~LOC~~ SOAJ
1.0000 | SUBCUTANEOUS | 5 refills | Status: DC
  Filled 2024-09-10 – 2024-09-29 (×2): qty 1.36, 28d supply, fill #0

## 2024-09-11 ENCOUNTER — Inpatient Hospital Stay: Attending: Hematology and Oncology | Admitting: Hematology and Oncology

## 2024-09-11 ENCOUNTER — Encounter: Payer: Self-pay | Admitting: Hematology and Oncology

## 2024-09-11 ENCOUNTER — Ambulatory Visit

## 2024-09-11 VITALS — BP 104/68 | HR 79 | Temp 98.2°F | Resp 18 | Ht 65.0 in | Wt 137.9 lb

## 2024-09-11 DIAGNOSIS — Z1501 Genetic susceptibility to malignant neoplasm of breast: Secondary | ICD-10-CM | POA: Insufficient documentation

## 2024-09-11 DIAGNOSIS — Z83438 Family history of other disorder of lipoprotein metabolism and other lipidemia: Secondary | ICD-10-CM | POA: Diagnosis not present

## 2024-09-11 DIAGNOSIS — Z8262 Family history of osteoporosis: Secondary | ICD-10-CM | POA: Insufficient documentation

## 2024-09-11 DIAGNOSIS — Z86718 Personal history of other venous thrombosis and embolism: Secondary | ICD-10-CM | POA: Diagnosis present

## 2024-09-11 DIAGNOSIS — Z1509 Genetic susceptibility to other malignant neoplasm: Secondary | ICD-10-CM | POA: Insufficient documentation

## 2024-09-11 DIAGNOSIS — R768 Other specified abnormal immunological findings in serum: Secondary | ICD-10-CM | POA: Diagnosis not present

## 2024-09-11 DIAGNOSIS — Z8379 Family history of other diseases of the digestive system: Secondary | ICD-10-CM | POA: Insufficient documentation

## 2024-09-11 DIAGNOSIS — Z86711 Personal history of pulmonary embolism: Secondary | ICD-10-CM | POA: Diagnosis present

## 2024-09-11 DIAGNOSIS — Z803 Family history of malignant neoplasm of breast: Secondary | ICD-10-CM | POA: Insufficient documentation

## 2024-09-11 DIAGNOSIS — Z801 Family history of malignant neoplasm of trachea, bronchus and lung: Secondary | ICD-10-CM | POA: Diagnosis not present

## 2024-09-11 DIAGNOSIS — Z8049 Family history of malignant neoplasm of other genital organs: Secondary | ICD-10-CM | POA: Insufficient documentation

## 2024-09-11 DIAGNOSIS — Z808 Family history of malignant neoplasm of other organs or systems: Secondary | ICD-10-CM | POA: Diagnosis not present

## 2024-09-11 DIAGNOSIS — Z8349 Family history of other endocrine, nutritional and metabolic diseases: Secondary | ICD-10-CM | POA: Diagnosis not present

## 2024-09-11 DIAGNOSIS — K5190 Ulcerative colitis, unspecified, without complications: Secondary | ICD-10-CM | POA: Insufficient documentation

## 2024-09-11 DIAGNOSIS — Z7901 Long term (current) use of anticoagulants: Secondary | ICD-10-CM | POA: Insufficient documentation

## 2024-09-11 DIAGNOSIS — Z1589 Genetic susceptibility to other disease: Secondary | ICD-10-CM | POA: Diagnosis not present

## 2024-09-11 DIAGNOSIS — Z800 Family history of malignant neoplasm of digestive organs: Secondary | ICD-10-CM | POA: Insufficient documentation

## 2024-09-11 DIAGNOSIS — Z79899 Other long term (current) drug therapy: Secondary | ICD-10-CM | POA: Diagnosis not present

## 2024-09-11 DIAGNOSIS — Z823 Family history of stroke: Secondary | ICD-10-CM | POA: Diagnosis not present

## 2024-09-11 DIAGNOSIS — R911 Solitary pulmonary nodule: Secondary | ICD-10-CM | POA: Insufficient documentation

## 2024-09-11 DIAGNOSIS — Z833 Family history of diabetes mellitus: Secondary | ICD-10-CM | POA: Diagnosis not present

## 2024-09-11 NOTE — Progress Notes (Signed)
 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 Symptoms improved with treatment.  She is on Eliquis , no complications. Plan to continue for a total of 1 yr, she will discontinue anticoagulation in March Hypercoag work up neg.  Ulcerative colitis Ulcerative colitis managed with Entyvio . Symptoms improved significantly.  - Continue Entyvio  infusions every two weeks per GI  Positive ANA test Positive ANA with homogeneous and speckled patterns suggests possible autoimmune disorder. Ulcerative colitis considered in differential diagnosis. Rheumatology evaluation recommended.  She is agreeable, referral placed.  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 Last MRI neg for malignancy We will continue to alternate mammogram and MRI for breast cancer screening.  Lung nodule FU CT ordered for December.   Orders Placed This Encounter  Procedures   MM 3D SCREENING MAMMOGRAM BILATERAL BREAST    Standing Status:   Future    Expected Date:   11/01/2024    Expiration Date:   09/11/2025    Reason for Exam (SYMPTOM  OR DIAGNOSIS REQUIRED):   High risk for breast cancer, ATM mutation    Preferred imaging location?:   [facility]    Is the patient pregnant?:   No   CT Chest Wo Contrast    Standing Status:   Future    Expected Date:   11/28/2024    Expiration Date:   09/11/2025    Is patient pregnant?:   No    Preferred imaging location?:   [facility]   MR BREAST BILATERAL W WO CONTRAST INC CAD    Standing Status:   Future    Expected Date:   05/06/2025    Expiration Date:   09/11/2025    If  indicated for the ordered procedure, I authorize the administration of contrast media per Radiology protocol:   Yes    What is the patient's sedation requirement?:   No Sedation    Does the patient have a pacemaker or implanted devices?:   No    Preferred imaging location?:   [facility] (table limit-550lbs)   Ambulatory referral to Rheumatology    Referral Priority:   Routine    Referral Type:   Consultation    Referral Reason:   Specialty Services Required    Requested Specialty:   Rheumatology    Number of Visits Requested:   1     HISTORY OF PRESENTING ILLNESS:   Amy Landry 38 y.o. female is here because of ATM mutation.   Discussed the use of AI scribe software for clinical note transcription with the patient, who gave verbal consent to proceed.  History of Present Illness  Amy Landry is a 38 year old female with a history of blood clots and ulcerative colitis who presents for follow-up on her current treatments and health status.  She has been on Eliquis  for venous thromboembolism. She experiences occasional tightness on the outside of her leg but no significant issues.  For her ulcerative colitis, she started self-injecting Entyvio  today, following four or five successful infusions that have significantly improved her condition. She administers the injections every two weeks.  She has a history of lung nodules, with a CT scan  in July showing stability. A follow-up CT is planned for December to monitor the nodules.  She mentions a past blood coagulation workup that was normal. She also recalls a previous CT scan for lung nodules, with a recommendation for a non-contrast follow-up in three to six months, which she completed in July.  She is not currently on GLP-1 medications due to concerns about their association with blood clots.  She is due for a mammogram in November and had a normal breast MRI in May.  Her social history includes participation in  bodybuilding competitions, which she finds rewarding and plans to continue. 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    SURGICAL HISTORY:  Bilateral breast augmentation  SOCIAL HISTORY: Social History   Socioeconomic History   Marital status: Single    Spouse name: Not on file   Number of children: 0   Years of education: Not on file   Highest education level: Not on file  Occupational History   Occupation: Respiratory therapist  Tobacco Use   Smoking status: Never   Smokeless tobacco: Never  Vaping Use   Vaping status: Never Used  Substance and Sexual Activity   Alcohol use: Yes    Comment: 1 per week   Drug use: Never   Sexual activity: Not on file  Other Topics Concern   Not on file  Social History Narrative   Not on file   Social Drivers of Health   Financial Resource Strain: Low Risk  (02/18/2024)   Received from [facility]   Overall Financial Resource Strain (CARDIA)    Difficulty of Paying Living Expenses: Not hard at all  Food Insecurity: No Food Insecurity (03/21/2024)   Received from [facility]   Hunger Vital Sign    Within the past 12 months, you worried that your food would run out before you got the money to buy more.: Never true    Within the past 12 months, the food you bought just didn't last and you didn't have money to get more.: Never true  Transportation Needs: No Transportation Needs (03/21/2024)   Received from [facility] -  Transportation    Lack of Transportation (Medical): No    Lack of Transportation (Non-Medical): No  Physical Activity: Sufficiently Active (02/18/2024)   Received from [facility]   Exercise Vital Sign    On average, how many days per week do you engage in moderate to strenuous exercise (like a brisk walk)?: 5 days    On average, how many minutes do you engage in exercise at this level?: 60 min  Stress: No Stress Concern Present (03/21/2024)   Received from [facility] - Occupational Stress Questionnaire    Feeling of Stress : Not at all  Recent Concern: Stress - Stress Concern Present (02/18/2024)   Received from [facility]   [employer] of Occupational Health - Occupational Stress Questionnaire    Feeling of Stress : To some extent  Social Connections: Socially Integrated (02/18/2024)   Received from [facility]   Social Network    How would you rate your social network (family, work, friends)?: Good participation with social networks  Intimate Partner Violence: Not At Risk (03/20/2024)   Received from [facility]   HITS    Over the last 12 months how often did your partner physically hurt you?: Never    Over the last 12 months how often did your partner insult you or talk down to you?: Never    Over the last 12 months how often did your partner threaten you with physical harm?: Never    Over the last 12 months how often did your partner scream or curse at you?: Never    FAMILY HISTORY: Family History  Problem Relation Age of Onset   Hypertension Mother    Crohn's disease Mother    Hypercholesterolemia Father    Hypertension Father    Heart disease Father    Diabetes Father    Melanoma Father    Esophageal cancer Father    Colon cancer Maternal Uncle    Breast cancer Paternal Aunt    Uterine cancer Maternal Grandmother    Breast cancer Maternal Grandmother    Lung cancer Maternal Grandmother    Heart disease Maternal  Grandfather    Breast cancer Paternal Grandmother    Osteoporosis Paternal Grandmother    CVA Paternal Grandfather    Diabetes Paternal Grandfather    Dementia Paternal Grandfather    Rectal cancer Neg Hx    Stomach cancer Neg Hx     ALLERGIES:  is allergic to miconazole and sulfa antibiotics.  MEDICATIONS:  Current Outpatient Medications  Medication Sig Dispense Refill   apixaban  (ELIQUIS ) 5 MG TABS tablet Take 1 tablet (5 mg total) by mouth 2 (two) times daily. 180 tablet 2   Budesonide  (UCERIS ) 2 MG/ACT FOAM Insert 2 mg (1 metered dose) per rectum once daily x 4 weeks (Patient not taking: Reported on 06/11/2024) 33.4 g 1   cephALEXin (KEFLEX) 500 MG capsule Take 500 mg by mouth every 6 (six) hours.     clindamycin (CLEOCIN T) 1 % SWAB APPLY SPARINGLY TO FACE IN THE MORNING AS DIRECTED     fluocinonide cream (LIDEX) 0.05 % Apply 1 Application topically as needed.     gabapentin (NEURONTIN) 100 MG capsule Take by mouth. (Patient not taking: Reported on 06/11/2024)     HYDROcodone-acetaminophen (NORCO/VICODIN) 5-325 MG tablet Take 1 tablet by mouth every 6 (six) hours as needed. (Patient not taking: Reported on 06/11/2024)     mesalamine  (CANASA ) 1000 MG suppository PLACE 1 SUPPOSITORY (1,000 MG TOTAL) RECTALLY AT BEDTIME. (Patient not taking: Reported on 06/11/2024) 90 suppository 1   mesalamine  (LIALDA ) 1.2 g EC tablet Take 4 tablets (4.8 g total) by mouth daily with breakfast. (Patient not taking: Reported on 06/11/2024) 120 tablet 1   methocarbamol (ROBAXIN) 500 MG tablet Take 500 mg by mouth as needed. (Patient not taking: Reported on 06/11/2024)     methocarbamol (ROBAXIN) 750 MG tablet Take 750 mg by mouth as needed. (Patient not taking: Reported on 06/11/2024)     Norethindrone-Ethinyl Estradiol-Fe Biphas (LO LOESTRIN FE) 1 MG-10 MCG / 10 MCG tablet Take 1 tablet by mouth daily.     spironolactone (ALDACTONE) 50 MG tablet Take 50 mg by mouth 2 (two) times daily.  traZODone (DESYREL)  50 MG tablet Take 50 mg by mouth at bedtime as needed.     tretinoin (RETIN-A) 0.1 % cream Apply 1 application topically at bedtime.     triamcinolone ointment (KENALOG) 0.1 % Apply 1 Application topically as needed. (Patient not taking: Reported on 06/11/2024)     valACYclovir (VALTREX) 500 MG tablet Take 500 mg by mouth as needed.     Vedolizumab  (ENTYVIO  PEN) 108 MG/0.68ML SOAJ Inject 1 Pen into the skin every 14 (fourteen) days. 1.36 mL 5   zolpidem (AMBIEN) 10 MG tablet Take 10 mg by mouth at bedtime as needed for sleep.     No current facility-administered medications for this visit.   PHYSICAL EXAMINATION: ECOG PERFORMANCE STATUS: 0 - Asymptomatic  Vitals:   09/11/24 1535  BP: 104/68  Pulse: 79  Resp: 18  Temp: 98.2 F (36.8 C)  SpO2: 100%       Filed Weights   09/11/24 1535  Weight: 137 lb 14.4 oz (62.6 kg)     GENERAL:alert, no distress and comfortable No cervical or axillary adenopathy Bilateral breasts examined. No palpable masses or regional adenopathy CTA bilaterally, RRR No LE edema.  LABORATORY DATA:  I have reviewed the data as listed Lab Results  Component Value Date   WBC 8.0 10/31/2022   HGB 13.6 10/31/2022   HCT 39.2 10/31/2022   MCV 93.9 10/31/2022   PLT 318.0 10/31/2022     Chemistry      Component Value Date/Time   NA 136 01/25/2023 1408   NA 133 (L) 03/14/2015 1127   K 3.9 01/25/2023 1408   K 4.2 03/14/2015 1127   CL 101 01/25/2023 1408   CL 101 03/14/2015 1127   CO2 25 01/25/2023 1408   CO2 25 03/14/2015 1127   BUN 17 01/25/2023 1408   BUN 16 03/14/2015 1127   CREATININE 1.08 01/25/2023 1408   CREATININE 0.91 03/14/2015 1127      Component Value Date/Time   CALCIUM 9.3 01/25/2023 1408   CALCIUM 8.7 (L) 03/14/2015 1127   ALKPHOS 56 06/11/2024 1642   ALKPHOS 83 03/14/2015 1127   AST 20 06/11/2024 1642   AST 61 (H) 03/14/2015 1127   ALT 19 06/11/2024 1642   ALT 63 (H) 03/14/2015 1127   BILITOT 0.7 06/11/2024 1642   BILITOT  0.5 03/14/2015 1127      RADIOGRAPHIC STUDIES: I have personally reviewed the radiological images as listed and agreed with the findings in the report. No results found.   She will be due for mammogram in November, ordered All questions were answered. The patient knows to call the clinic with any problems, questions or concerns. I spent 30 minutes in the care of this patient including H and P, review of records, counseling and coordination of care.    Amber Stalls, MD 09/11/2024 5:14 PM

## 2024-09-12 ENCOUNTER — Encounter: Payer: Self-pay | Admitting: Hematology and Oncology

## 2024-09-17 ENCOUNTER — Telehealth: Payer: Self-pay

## 2024-09-17 NOTE — Telephone Encounter (Signed)
 Attempted to call pt. Amy Landry for call back

## 2024-09-29 ENCOUNTER — Other Ambulatory Visit (HOSPITAL_COMMUNITY): Payer: Self-pay

## 2024-09-29 ENCOUNTER — Encounter (INDEPENDENT_AMBULATORY_CARE_PROVIDER_SITE_OTHER): Payer: Self-pay

## 2024-09-29 ENCOUNTER — Other Ambulatory Visit: Payer: Self-pay

## 2024-09-29 NOTE — Progress Notes (Signed)
 Specialty Pharmacy Refill Coordination Note  Amy Landry is a 38 y.o. female contacted today regarding refills of specialty medication(s) Vedolizumab  (Entyvio  Pen)   Patient requested Delivery   Delivery date: 10/02/24   Verified address: [address] [name] [address]   Medication will be filled on 10/01/24.

## 2024-09-30 ENCOUNTER — Other Ambulatory Visit: Payer: Self-pay

## 2024-10-07 ENCOUNTER — Encounter: Payer: Self-pay | Admitting: Hematology and Oncology

## 2024-10-15 ENCOUNTER — Inpatient Hospital Stay: Attending: Hematology and Oncology | Admitting: Hematology and Oncology

## 2024-10-15 VITALS — BP 120/75 | HR 79 | Temp 97.9°F | Resp 16 | Wt 135.2 lb

## 2024-10-15 DIAGNOSIS — Z1589 Genetic susceptibility to other disease: Secondary | ICD-10-CM

## 2024-10-15 DIAGNOSIS — M549 Dorsalgia, unspecified: Secondary | ICD-10-CM | POA: Diagnosis not present

## 2024-10-15 DIAGNOSIS — Z1509 Genetic susceptibility to other malignant neoplasm: Secondary | ICD-10-CM | POA: Diagnosis not present

## 2024-10-15 DIAGNOSIS — Z7982 Long term (current) use of aspirin: Secondary | ICD-10-CM | POA: Insufficient documentation

## 2024-10-15 DIAGNOSIS — R911 Solitary pulmonary nodule: Secondary | ICD-10-CM | POA: Insufficient documentation

## 2024-10-15 DIAGNOSIS — G8929 Other chronic pain: Secondary | ICD-10-CM | POA: Insufficient documentation

## 2024-10-15 DIAGNOSIS — I2694 Multiple subsegmental pulmonary emboli without acute cor pulmonale: Secondary | ICD-10-CM

## 2024-10-15 DIAGNOSIS — Z86711 Personal history of pulmonary embolism: Secondary | ICD-10-CM | POA: Diagnosis present

## 2024-10-15 DIAGNOSIS — R7689 Other specified abnormal immunological findings in serum: Secondary | ICD-10-CM

## 2024-10-15 DIAGNOSIS — Z1501 Genetic susceptibility to malignant neoplasm of breast: Secondary | ICD-10-CM | POA: Diagnosis not present

## 2024-10-15 DIAGNOSIS — Z86718 Personal history of other venous thrombosis and embolism: Secondary | ICD-10-CM | POA: Insufficient documentation

## 2024-10-15 DIAGNOSIS — K5190 Ulcerative colitis, unspecified, without complications: Secondary | ICD-10-CM | POA: Insufficient documentation

## 2024-10-15 NOTE — Progress Notes (Unsigned)
 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 Symptoms improved with treatment.  She is on Eliquis , no complications. Plan to continue for a total of 1 yr, she will discontinue anticoagulation in March Hypercoag work up neg.  Ulcerative colitis Ulcerative colitis managed with Entyvio . Symptoms improved significantly.  - Continue Entyvio  infusions every two weeks per GI  Positive ANA test Positive ANA with homogeneous and speckled patterns suggests possible autoimmune disorder. Ulcerative colitis considered in differential diagnosis. Rheumatology evaluation recommended.  She is agreeable, referral placed.  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 Last MRI neg for malignancy We will continue to alternate mammogram and MRI for breast cancer screening.  Lung nodule FU CT ordered for December.   No orders of the defined types were placed in this encounter.    HISTORY OF PRESENTING ILLNESS:   Amy Landry 38 y.o. female is here because of ATM mutation.   Discussed the use of AI scribe software for clinical note transcription with the patient, who gave verbal consent to proceed.  History of Present Illness  Amy Landry is a 38 year old female with a history of blood clots and ulcerative colitis who presents for follow-up on her current treatments and health status.   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    SURGICAL HISTORY:  Bilateral breast augmentation  SOCIAL HISTORY: Social History   Socioeconomic History   Marital status: Single    Spouse name: Not on file   Number of children: 0   Years of education: Not on file   Highest education level: Not on file  Occupational History   Occupation: Respiratory therapist  Tobacco Use   Smoking status: Never   Smokeless tobacco: Never  Vaping Use   Vaping status: Never Used  Substance and Sexual Activity   Alcohol use: Yes    Comment: 1 per week   Drug use: Never   Sexual activity: Not on file  Other Topics Concern   Not on file  Social History Narrative   Not on file   Social Drivers of Health   Financial Resource Strain: Low Risk  (02/18/2024)   Received from [facility]   Overall Financial Resource Strain (CARDIA)    Difficulty of Paying Living Expenses: Not hard at all  Food Insecurity: No Food Insecurity (03/21/2024)   Received from [facility]   Hunger Vital Sign    Within the past 12 months, you worried that your food would run out before you got the money to buy more.: Never true    Within the past 12 months, the food you  bought just didn't last and you didn't have money to get more.: Never true  Transportation Needs: No Transportation Needs (03/21/2024)   Received from [facility]   PRAPARE - Transportation    Lack of Transportation (Medical): No    Lack of Transportation (Non-Medical): No  Physical Activity: Sufficiently Active (02/18/2024)   Received from [facility]   Exercise Vital Sign    On average, how many days per week do you engage in moderate to strenuous exercise (like a brisk walk)?: 5 days    On average, how many minutes do you engage in  exercise at this level?: 60 min  Stress: No Stress Concern Present (03/21/2024)   Received from [facility] of Occupational Health - Occupational Stress Questionnaire    Feeling of Stress : Not at all  Recent Concern: Stress - Stress Concern Present (02/18/2024)   Received from [facility] of Occupational Health - Occupational Stress Questionnaire    Feeling of Stress : To some extent  Social Connections: Socially Integrated (02/18/2024)   Received from [facility]   Social Network    How would you rate your social network (family, work, friends)?: Good participation with social networks  Intimate Partner Violence: Not At Risk (03/20/2024)   Received from [facility]   HITS    Over the last 12 months how often did your partner physically hurt you?: Never    Over the last 12 months how often did your partner insult you or talk down to you?: Never    Over the last 12 months how often did your partner threaten you with physical harm?: Never    Over the last 12 months how often did your partner scream or curse at you?: Never    FAMILY HISTORY: Family History  Problem Relation Age of Onset   Hypertension Mother    Crohn's disease Mother    Hypercholesterolemia Father    Hypertension Father    Heart disease Father    Diabetes Father    Melanoma Father    Esophageal cancer Father    Colon cancer Maternal Uncle    Breast cancer Paternal Aunt    Uterine cancer Maternal Grandmother    Breast cancer Maternal Grandmother    Lung cancer Maternal Grandmother    Heart disease Maternal Grandfather    Breast cancer Paternal Grandmother    Osteoporosis Paternal Grandmother    CVA Paternal Grandfather    Diabetes Paternal Grandfather    Dementia Paternal Grandfather    Rectal cancer Neg Hx    Stomach cancer Neg Hx     ALLERGIES:  is allergic to miconazole and sulfa antibiotics.  MEDICATIONS:  Current Outpatient Medications  Medication Sig  Dispense Refill   apixaban  (ELIQUIS ) 5 MG TABS tablet Take 1 tablet (5 mg total) by mouth 2 (two) times daily. 180 tablet 2   Budesonide  (UCERIS ) 2 MG/ACT FOAM Insert 2 mg (1 metered dose) per rectum once daily x 4 weeks (Patient not taking: Reported on 06/11/2024) 33.4 g 1   cephALEXin (KEFLEX) 500 MG capsule Take 500 mg by mouth every 6 (six) hours.     clindamycin (CLEOCIN T) 1 % SWAB APPLY SPARINGLY TO FACE IN THE MORNING AS DIRECTED     fluocinonide cream (LIDEX) 0.05 % Apply 1 Application topically as needed.     gabapentin (NEURONTIN) 100 MG capsule Take by mouth. (Patient not taking: Reported on 06/11/2024)     HYDROcodone-acetaminophen (NORCO/VICODIN) 5-325 MG  tablet Take 1 tablet by mouth every 6 (six) hours as needed. (Patient not taking: Reported on 06/11/2024)     mesalamine  (CANASA ) 1000 MG suppository PLACE 1 SUPPOSITORY (1,000 MG TOTAL) RECTALLY AT BEDTIME. (Patient not taking: Reported on 06/11/2024) 90 suppository 1   mesalamine  (LIALDA ) 1.2 g EC tablet Take 4 tablets (4.8 g total) by mouth daily with breakfast. (Patient not taking: Reported on 06/11/2024) 120 tablet 1   methocarbamol (ROBAXIN) 500 MG tablet Take 500 mg by mouth as needed. (Patient not taking: Reported on 06/11/2024)     methocarbamol (ROBAXIN) 750 MG tablet Take 750 mg by mouth as needed. (Patient not taking: Reported on 06/11/2024)     Norethindrone-Ethinyl Estradiol-Fe Biphas (LO LOESTRIN FE) 1 MG-10 MCG / 10 MCG tablet Take 1 tablet by mouth daily.     spironolactone (ALDACTONE) 50 MG tablet Take 50 mg by mouth 2 (two) times daily.     traZODone (DESYREL) 50 MG tablet Take 50 mg by mouth at bedtime as needed.     tretinoin (RETIN-A) 0.1 % cream Apply 1 application topically at bedtime.     triamcinolone ointment (KENALOG) 0.1 % Apply 1 Application topically as needed. (Patient not taking: Reported on 06/11/2024)     valACYclovir (VALTREX) 500 MG tablet Take 500 mg by mouth as needed.     Vedolizumab  (ENTYVIO  PEN) 108  MG/0.68ML SOAJ Inject 1 Pen into the skin every 14 (fourteen) days. 1.36 mL 5   zolpidem (AMBIEN) 10 MG tablet Take 10 mg by mouth at bedtime as needed for sleep.     No current facility-administered medications for this visit.   PHYSICAL EXAMINATION: ECOG PERFORMANCE STATUS: 0 - Asymptomatic  Vitals:   10/15/24 1521  BP: 120/75  Pulse: 79  Resp: 16  Temp: 97.9 F (36.6 C)  SpO2: 100%       Filed Weights   10/15/24 1521  Weight: 135 lb 3.2 oz (61.3 kg)     GENERAL:alert, no distress and comfortable No cervical or axillary adenopathy Bilateral breasts examined. No palpable masses or regional adenopathy CTA bilaterally, RRR No LE edema.  LABORATORY DATA:  I have reviewed the data as listed Lab Results  Component Value Date   WBC 8.0 10/31/2022   HGB 13.6 10/31/2022   HCT 39.2 10/31/2022   MCV 93.9 10/31/2022   PLT 318.0 10/31/2022     Chemistry      Component Value Date/Time   NA 136 01/25/2023 1408   NA 133 (L) 03/14/2015 1127   K 3.9 01/25/2023 1408   K 4.2 03/14/2015 1127   CL 101 01/25/2023 1408   CL 101 03/14/2015 1127   CO2 25 01/25/2023 1408   CO2 25 03/14/2015 1127   BUN 17 01/25/2023 1408   BUN 16 03/14/2015 1127   CREATININE 1.08 01/25/2023 1408   CREATININE 0.91 03/14/2015 1127      Component Value Date/Time   CALCIUM 9.3 01/25/2023 1408   CALCIUM 8.7 (L) 03/14/2015 1127   ALKPHOS 56 06/11/2024 1642   ALKPHOS 83 03/14/2015 1127   AST 20 06/11/2024 1642   AST 61 (H) 03/14/2015 1127   ALT 19 06/11/2024 1642   ALT 63 (H) 03/14/2015 1127   BILITOT 0.7 06/11/2024 1642   BILITOT 0.5 03/14/2015 1127      RADIOGRAPHIC STUDIES: I have personally reviewed the radiological images as listed and agreed with the findings in the report. No results found.   She will be due for mammogram in November, ordered All  questions were answered. The patient knows to call the clinic with any problems, questions or concerns. I spent 30 minutes in the care  of this patient including H and P, review of records, counseling and coordination of care.    Amber Stalls, MD 10/15/2024 3:27 PM

## 2024-10-15 NOTE — Progress Notes (Unsigned)
 [facility] CONSULT NOTE  Patient Care Team: [name], NP as PCP - General (Adult Health Nurse Practitioner)  CHIEF COMPLAINTS/PURPOSE OF CONSULTATION:  ATM heterozygous mutation  Assessment & Plan  Provoked venous thromboembolism, status post anticoagulation Completed seven months of anticoagulation with Eliquis  for provoked VTE, likely due to GLP-1 receptor agonists and birth control. Risk of re-thrombosis is approximately 1% per year. - Discontinue Eliquis . - Initiate baby aspirin. - Educated on DVT and PE symptoms, advised immediate medical attention if symptoms occur. - Advised to avoid GLP-1 receptor agonists and birth control.  Back pain Chronic back pain possibly related to herniated L5-S1 disc. Differential includes ankylosing spondylitis due to autoimmune background. - Follow up with primary care for evaluation and imaging. - Consider sports medicine referral if symptoms persist. - Discontinue Eliquis , wait three days, then start NSAIDs like meloxicam.  Ulcerative colitis Managed with Entyvio .  Lung nodule surveillance Scheduled for follow-up CT scan in December. - Confirm and schedule CT scan for December.  Hereditary breast cancer risk Undergoing annual MRI surveillance. - Continue annual MRI surveillance. - start mammogram at the age of 59.   No orders of the defined types were placed in this encounter.    HISTORY OF PRESENTING ILLNESS:   Amy Landry 38 y.o. female is here because of ATM mutation.   Discussed the use of AI scribe software for clinical note transcription with the patient, who gave verbal consent to proceed.  History of Present Illness  Amy Landry is a 38 year old female who presents with a request to discontinue Eliquis  due to back pain management.  She has been experiencing back pain for approximately two months, which improves with movement and worsens when sitting or lying down. The pain is  described as a sensation of the side locking up, with concern for radiation down her leg. She has a history of a herniated L5 S1 disc. Treatment with two steroid packs, a muscle relaxer, and a Toradol shot provided significant relief, although the muscle relaxer causes sleepiness, so she only takes it at night. Tylenol has been ineffective, and she cannot take NSAIDs due to her current medication regimen.  She has been on Eliquis  for seven months following a blood clot. She reports a history of using weight loss medication and birth control at the time of the blood clot. She is considering discontinuing Eliquis  to manage her back pain with NSAIDs. An upcoming appointment with her primary care provider is scheduled for a full workup and imaging, as well as a rheumatology appointment in early December.  She is actively involved in fitness, working out with a trainer five days a week, including an hour of cardio and weightlifting. Currently on a low-calorie diet of about 1,000 calories per day in preparation for an upcoming show, she reports feeling tired and depleted, especially after cardio sessions. She maintains hydration by drinking a gallon of water daily.  She is currently on Entyvio  for ulcerative colitis and has a scheduled CT scan for a lung nodule in December. She is also undergoing annual MRI screenings due to a family history of breast cancer, with the next MRI scheduled for May.  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    SURGICAL HISTORY:  Bilateral  breast augmentation  SOCIAL HISTORY: Social History   Socioeconomic History   Marital status: Single    Spouse name: Not on file   Number of children: 0   Years of education: Not on file   Highest education level: Not on file  Occupational History   Occupation: Respiratory therapist  Tobacco Use   Smoking status: Never   Smokeless tobacco: Never  Vaping Use   Vaping status: Never Used  Substance and Sexual Activity   Alcohol use: Yes    Comment: 1 per week   Drug use: Never   Sexual activity: Not on file  Other Topics Concern   Not on file  Social History Narrative   Not on file   Social Drivers of Health   Financial Resource Strain: Low Risk  (02/18/2024)   Received from [facility]   Overall Financial Resource Strain (CARDIA)    Difficulty of Paying Living Expenses: Not hard at all  Food Insecurity: No Food Insecurity (03/21/2024)   Received from [facility]   Hunger Vital Sign    Within the past 12 months, you worried that your food would run out before you got the money to buy more.: Never true    Within the past 12 months, the food you bought just didn't last and you didn't have money to get more.: Never true  Transportation Needs: No Transportation Needs (03/21/2024)   Received from [facility] - Transportation    Lack of Transportation (Medical): No    Lack of Transportation (Non-Medical): No  Physical Activity: Sufficiently Active (02/18/2024)   Received from [facility]   Exercise Vital Sign    On average, how many days per week do you engage in moderate to strenuous exercise (like a brisk walk)?: 5 days    On average, how many minutes do you engage in exercise at this level?: 60 min  Stress: No Stress Concern Present (03/21/2024)   Received from [facility] - Occupational Stress Questionnaire    Feeling of Stress : Not at all  Recent Concern: Stress - Stress Concern Present (02/18/2024)   Received from  [facility] - Occupational Stress Questionnaire    Feeling of Stress : To some extent  Social Connections: Socially Integrated (02/18/2024)   Received from [facility]   Social Network    How would you rate your social network (family, work, friends)?: Good participation with social networks  Intimate Partner Violence: Not At Risk (03/20/2024)   Received from [facility]   HITS    Over the last 12 months how often did your partner physically hurt you?: Never    Over the last 12 months how often did your partner insult you or talk down to you?: Never    Over the last 12 months how often did your partner threaten you with physical harm?: Never    Over the last 12 months how often did your partner scream or curse at you?: Never    FAMILY HISTORY: Family History  Problem Relation Age of Onset   Hypertension Mother  Crohn's disease Mother    Hypercholesterolemia Father    Hypertension Father    Heart disease Father    Diabetes Father    Melanoma Father    Esophageal cancer Father    Colon cancer Maternal Uncle    Breast cancer Paternal Aunt    Uterine cancer Maternal Grandmother    Breast cancer Maternal Grandmother    Lung cancer Maternal Grandmother    Heart disease Maternal Grandfather    Breast cancer Paternal Grandmother    Osteoporosis Paternal Grandmother    CVA Paternal Grandfather    Diabetes Paternal Grandfather    Dementia Paternal Grandfather    Rectal cancer Neg Hx    Stomach cancer Neg Hx     ALLERGIES:  is allergic to miconazole and sulfa antibiotics.  MEDICATIONS:  Current Outpatient Medications  Medication Sig Dispense Refill   apixaban  (ELIQUIS ) 5 MG TABS tablet Take 1 tablet (5 mg total) by mouth 2 (two) times daily. 180 tablet 2   Budesonide  (UCERIS ) 2 MG/ACT FOAM Insert 2 mg (1 metered dose) per rectum once daily x 4 weeks (Patient not taking: Reported on 06/11/2024) 33.4 g 1   cephALEXin (KEFLEX) 500 MG  capsule Take 500 mg by mouth every 6 (six) hours.     clindamycin (CLEOCIN T) 1 % SWAB APPLY SPARINGLY TO FACE IN THE MORNING AS DIRECTED     fluocinonide cream (LIDEX) 0.05 % Apply 1 Application topically as needed.     gabapentin (NEURONTIN) 100 MG capsule Take by mouth. (Patient not taking: Reported on 06/11/2024)     HYDROcodone-acetaminophen (NORCO/VICODIN) 5-325 MG tablet Take 1 tablet by mouth every 6 (six) hours as needed. (Patient not taking: Reported on 06/11/2024)     mesalamine  (CANASA ) 1000 MG suppository PLACE 1 SUPPOSITORY (1,000 MG TOTAL) RECTALLY AT BEDTIME. (Patient not taking: Reported on 06/11/2024) 90 suppository 1   mesalamine  (LIALDA ) 1.2 g EC tablet Take 4 tablets (4.8 g total) by mouth daily with breakfast. (Patient not taking: Reported on 06/11/2024) 120 tablet 1   methocarbamol (ROBAXIN) 500 MG tablet Take 500 mg by mouth as needed. (Patient not taking: Reported on 06/11/2024)     methocarbamol (ROBAXIN) 750 MG tablet Take 750 mg by mouth as needed. (Patient not taking: Reported on 06/11/2024)     Norethindrone-Ethinyl Estradiol-Fe Biphas (LO LOESTRIN FE) 1 MG-10 MCG / 10 MCG tablet Take 1 tablet by mouth daily.     spironolactone (ALDACTONE) 50 MG tablet Take 50 mg by mouth 2 (two) times daily.     traZODone (DESYREL) 50 MG tablet Take 50 mg by mouth at bedtime as needed.     tretinoin (RETIN-A) 0.1 % cream Apply 1 application topically at bedtime.     triamcinolone ointment (KENALOG) 0.1 % Apply 1 Application topically as needed. (Patient not taking: Reported on 06/11/2024)     valACYclovir (VALTREX) 500 MG tablet Take 500 mg by mouth as needed.     Vedolizumab  (ENTYVIO  PEN) 108 MG/0.68ML SOAJ Inject 1 Pen into the skin every 14 (fourteen) days. 1.36 mL 5   zolpidem (AMBIEN) 10 MG tablet Take 10 mg by mouth at bedtime as needed for sleep.     No current facility-administered medications for this visit.   PHYSICAL EXAMINATION: ECOG PERFORMANCE STATUS: 0 -  Asymptomatic  Vitals:   10/15/24 1521  BP: 120/75  Pulse: 79  Resp: 16  Temp: 97.9 F (36.6 C)  SpO2: 100%       Filed Weights   10/15/24 1521  Weight: 135 lb 3.2 oz (61.3 kg)     GENERAL:alert, no distress and comfortable No cervical or axillary adenopathy Bilateral breasts examined. No palpable masses or regional adenopathy CTA bilaterally, RRR No LE edema.  LABORATORY DATA:  I have reviewed the data as listed Lab Results  Component Value Date   WBC 8.0 10/31/2022   HGB 13.6 10/31/2022   HCT 39.2 10/31/2022   MCV 93.9 10/31/2022   PLT 318.0 10/31/2022     Chemistry      Component Value Date/Time   NA 136 01/25/2023 1408   NA 133 (L) 03/14/2015 1127   K 3.9 01/25/2023 1408   K 4.2 03/14/2015 1127   CL 101 01/25/2023 1408   CL 101 03/14/2015 1127   CO2 25 01/25/2023 1408   CO2 25 03/14/2015 1127   BUN 17 01/25/2023 1408   BUN 16 03/14/2015 1127   CREATININE 1.08 01/25/2023 1408   CREATININE 0.91 03/14/2015 1127      Component Value Date/Time   CALCIUM 9.3 01/25/2023 1408   CALCIUM 8.7 (L) 03/14/2015 1127   ALKPHOS 56 06/11/2024 1642   ALKPHOS 83 03/14/2015 1127   AST 20 06/11/2024 1642   AST 61 (H) 03/14/2015 1127   ALT 19 06/11/2024 1642   ALT 63 (H) 03/14/2015 1127   BILITOT 0.7 06/11/2024 1642   BILITOT 0.5 03/14/2015 1127      RADIOGRAPHIC STUDIES: I have personally reviewed the radiological images as listed and agreed with the findings in the report. No results found.   She will be due for mammogram in November, ordered All questions were answered. The patient knows to call the clinic with any problems, questions or concerns. I spent 30 minutes in the care of this patient including H and P, review of records, counseling and coordination of care.    Amber Stalls, MD 10/15/2024 3:23 PM

## 2024-10-17 ENCOUNTER — Other Ambulatory Visit: Payer: Self-pay

## 2024-10-17 ENCOUNTER — Encounter: Payer: Self-pay | Admitting: Gastroenterology

## 2024-10-17 MED ORDER — ENTYVIO PEN 108 MG/0.68ML ~~LOC~~ SOAJ: 1.0000 | SUBCUTANEOUS | 5 refills | Status: AC

## 2024-10-22 ENCOUNTER — Other Ambulatory Visit: Payer: Self-pay

## 2024-10-22 NOTE — Progress Notes (Signed)
 Per chart notes, due to patients insurance, specialty medication must be filled through [facility]. New rx sent to new pharmacy, dis-enrolling patient.

## 2024-11-05 NOTE — ED Provider Notes (Signed)
 [facility]  ED Provider Note  Amy Landry 38 y.o. female DOB: 03-10-1986 MRN: [id number] History   Chief Complaint  Patient presents with  . Leg Swelling    Patient presents for left sided lower leg swelling. History of DVT and PE. Taken off eliquis  on 10/09/24, but has still been taking ASA. Reports that she is also having some shortness of breath with exertion. States that this is similar to when she had a PE and DVT.   SABRA Shortness of Breath   Patient presents to the emergency room stating that this spring developed pain in her left leg diagnosed as having DVT/that eventually bilateral pulmonary embolism placed on Eliquis  did well/ Last month taken off of Eliquis  in order for her to start back on Mobic because of her back issues hematologist put her on aspirin 80 mg had completed a course of 7 months  Has now developed left leg calf discomfort similar to her last episode of DVT and presents here for further evaluation  Past medical history significant for blood clot, ulcerative colitis  Medicines include aspirin, Aldactone, Ambien, Mobic, Intivia  Allergies to sulfa, Monistat  Operations include breast augmentation, bilateral tubal ligation       Past Medical History:  Diagnosis Date  . Acute cystitis 12/10/2022  . Allergic rhinitis 12/10/2022  . Anxiety   . Arthritis   . Backache 12/10/2022  . Biallelic mutation of ATM gene   . Cervical dysplasia   . Cystic acne   . Degeneration of lumbar or lumbosacral intervertebral disc 12/10/2022  . Depression   . Diarrhea 12/10/2022  . Disturbances in tooth eruption 12/10/2022  . Dyshidrotic eczema   . Dysmenorrhea 12/10/2022  . Eczema, dyshidrotic 01/18/2022  . Female pelvic pain 12/10/2022   Recent intense pain not relieving, poss RT E-mycin???  Will get pelvic US  to r/o cyst.  Transient nature of cyst discussed with reducing occurrence benefit of BCPs.  Check with PCM for US  results as  I will be on leave when exam completed.  . Kidney stone 02/12/2020  . Kidney stones   . Lower back pain 12/10/2022  . Menorrhagia 12/10/2022  . Migraine headache 12/10/2022  . Monoallelic mutation of ATM gene 95/86/7977   Last Assessment & Plan: Formatting of this note might be different from the original. 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 It is estimated that approximately 3 percent of White people in the [place]  are ATM het  . Oral allergy syndrome 12/10/2022  . 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 Rectal bleeding 10/31/2022  . Restless legs syndrome 12/10/2022  . Scar 12/10/2022  . Symptoms involving urinary system 12/10/2022  . Ulcerative colitis (*)     Past Surgical History:  Procedure Laterality Date  . Breast surgery     augmentation  . Cosmetic surgery  07/2010   Breast augmentation  . Lithotripsy  03/2014  . Tubal ligation    . Wisdom teeth      Social History   Substance and Sexual Activity  Alcohol Use Yes   Comment: Rarely   Tobacco Use History[1] E-Cigarettes  . Vaping Use Never User   . Start Date    . Cartridges/Day    . Quit Date     Social History   Substance and Sexual Activity  Drug Use No  Allergies[2]  Current Discharge Medication List     CONTINUE these medications which have NOT CHANGED   Details  clindamycin (CLEOCIN T) 1% SWAB swab SMARTSIG:sparingly Topical Every Morning    spironolactone (ALDACTONE) 50 mg tablet Take one tablet (50 mg dose) by mouth 2 (two) times daily.    tiZANidine (ZANAFLEX) 4 mg tablet Take one tablet (4 mg dose) by mouth at bedtime. Qty: 30 tablet, Refills: 0   Associated Diagnoses: Right sided sciatica    traZODone (DESYREL) 50 mg tablet Take one tablet (50 mg dose) by mouth at bedtime as needed.    tretinoin  (RETIN-A) 0.1 % cream SMARTSIG:sparingly Topical Every Night    triamcinolone (KENALOG) 0.1% ointment Apply one application. topically as needed.    zolpidem tartrate (AMBIEN) 10 mg tablet Take one tablet (10 mg dose) by mouth at bedtime as needed for Sleep. Max Daily Amount: 10 mg Qty: 30 tablet, Refills: 2   Comments: This request is for a new prescription for a controlled substance as required by Federal/State law.        Primary Survey  Primary Survey  Review of Systems   Review of Systems  Constitutional:  Positive for fatigue.  Respiratory:  Negative for shortness of breath.   Cardiovascular:  Negative for chest pain.  Gastrointestinal:  Negative for abdominal pain, nausea and vomiting.  Musculoskeletal:  Positive for myalgias.  All other systems reviewed and are negative.   Physical Exam   ED Triage Vitals [11/05/24 1837]  BP 135/87  Heart Rate 83  Resp 18  SpO2 100 %  Temp 99.3 F (37.4 C)    Physical Exam  Nursing note and vitals reviewed. Constitutional: She appears well-developed and well-nourished.  HENT:  Head: Normocephalic and atraumatic.  Eyes: Pupils are equal, round, and reactive to light.  Neck: Normal range of motion. Neck supple.  Cardiovascular: Normal rate and regular rhythm.  Pulmonary/Chest: Respiratory effort normal and breath sounds normal.  Abdominal: Soft.  Musculoskeletal: Normal range of motion.     Cervical back: Normal range of motion and neck supple.   Neurological: She is alert and oriented to person, place, and time.  Skin: Skin is warm. Skin is dry.  Psychiatric: She has a normal mood and affect.     ED Course   Lab results:   CBC AND DIFFERENTIAL - Abnormal      Result Value   WBC 10.0     RBC 4.29     HGB 13.7     HCT 41.3     MCV 96.3 (*)    MCH 31.9     MCHC 33.2     Plt Ct 282     RDW SD 45.1     MPV 9.2 (*)    NRBC% 0.0     Absolute NRBC Count 0.00     NEUTROPHIL % 57.4     LYMPHOCYTE % 31.6      MONOCYTE % 6.7     Eosinophil % 3.6     BASOPHIL % 0.4     IG% 0.3     ABSOLUTE NEUTROPHIL COUNT 5.72     ABSOLUTE LYMPHOCYTE COUNT 3.15     Absolute Monocyte Count 0.67     Absolute Eosinophil Count 0.36     Absolute Basophil Count 0.04     Absolute Immature Granulocyte Count 0.03    COMPREHENSIVE METABOLIC PANEL - Abnormal   Na 138     Potassium 4.0     Cl 103  CO2 24     AGAP 11     Glucose 148 (*)    BUN 24 (*)    Creatinine 1.14 (*)    Ca 9.6     ALK PHOS 76     T Bili 0.9     Total Protein 7.7     Alb 4.5     GLOBULIN 3.2     ALBUMIN/GLOBULIN RATIO 1.4     BUN/CREAT RATIO 21.1     ALT 27     AST 22     eGFR 63     Comment: Normal GFR (glomerular filtration rate) > 60 mL/min/1.73 meters squared, < 60 may include impaired kidney function. Calculation based on the Chronic Kidney Disease Epidemiology Collaboration (CK-EPI)equation refit without adjustment for race.    Imaging:   US  VENOUS LOWER EXTREMITY LEFT   Narrative:    TECHNIQUE: The veins of the left lower extremity were interrogated from the visible common femoral vein to the distal popliteal vein.  The junction of the greater saphenous with the common femoral vein as well as the posterior tibial veins were evaluated.  Gray scale, color, spectral, and doppler sonography was utilized and analyzed. COMPARISON: None.   INDICATION: Leg deep vein thrombosis (DVT) suspected    FINDINGS:  There is echogenic thrombus in the left lower extremity from the proximal femoral vein to the popliteal vein. Femoral vein thrombus appears nonocclusive and peripheral. This is probably chronic. There is more acute appearing partially occlusive thrombus in the left popliteal vein. Areas demonstrate incomplete venous compression. Line note that there is also thrombus within the small saphenous vein and gastrocnemius veins.   INCIDENTAL FINDINGS:  None    Impression:    IMPRESSION:   Exam is positive for DVT in the left lower  extremity which appears chronic in the proximal to distal femoral vein and acute in the left popliteal vein. Superficial venous thrombus in the small saphenous vein and gastrocnemius veins.    ###CODE CRITICAL REPORT###  Automated notification pathway concerning the above report was activated at the time below.  ORDERING PROVIDER (Unless stated above may not be the provider contacted): LYNWOOD JAYSON LIFE TIME: 11/05/2024 8:26 PM  Electronically Signed by: Sonny Livings, MD on 11/05/2024 8:26 PM  CT ANGIO CHEST PULMONARY   Narrative:    INDICATION: Shortness of breath.  COMPARISON:  03/20/2024  TECHNIQUE:  CT ANGIO CHEST PULMONARY was performed during IV administration of contrast. Contrast: 65 mL IOPAMIDOL 76 % IV SOLN   Image post processing was performed creating multi planar 2D and angiographic 3D MIP, shaded surface rendering or 3D volume rendering images for review and comparison. Radiation dose reduction was utilized (automated exposure control, mA or kV adjustment based on patient size, or iterative image reconstruction).  Contrast bolus quality:  satisfactory  FINDINGS:  SUPPORT APPARATUS: Bilateral breast implants.   PULMONARY ARTERIES: No pulmonary emboli are identified. Previously visualized small filling defects in the right lower lobe are no longer seen. RV/LV ratio: <0.9 Interventricular septum: No bowing IVC/hepatic vein reflux: Present  LUNGS/PLEURA: Nodular consolidative opacity in the peripheral aspect of the right upper lobe on series 4, image 70 measures about 1.5 cm and has decreased from prior when it measured about 2.6 cm. Additional areas of nodular opacity at the right lower lobe base have resolved. No new infiltrates. Minimal dependent atelectasis. No pneumothorax.  No pleural effusions.  HEART/MEDIASTINUM: Cardiac size is normal.  No calcification of the coronary arteries. No acute thoracic aortic  abnormalities.  No hilar, mediastinal, or axillary  lymphadenopathy.  MUSCULOSKELETAL: No acute or destructive osseous processes.  MISC: N.A.    Impression:    IMPRESSION:  1.  Negative CTA for pulmonary embolism. 2. One of the prior nodular opacities in the right lung has decreased in size, likely resolving infection, and the other 2 nodular infiltrates on the study are no longer present. There is no new infiltrate or other acute cardiopulmonary abnormality identified.  Electronically Signed by: Reyes Luna, MD on 11/05/2024 9:38 PM     ECG: ECG Results          ECG 12 lead (In process)      Collection Time Result Time Acquisition Device Systolic BP Diastolic BP Ventricular Rate Atrial Rate P-R Interval QRS Duration Q-T Interval QTC Calculation(Bazett) Calculated P Axis   11/05/24 20:07:31 11/05/24 20:14:11 D3K 167 70 67 67 168 72 406 429 63         Collection Time Result Time Calculated R Axis Calculated T Axis ECGDiag   11/05/24 20:07:31 11/05/24 20:14:11 81 59 Normal sinus rhythm with sinus arrhythmia Normal ECG When compared with ECG of 20-Mar-2024 23:08, Nonspecific T wave abnormality no longer evident in Inferior leads                                                                                          Pre-Sedation Procedures    Medical Decision Making Patient has a history of past DVT with subsequent bilateral pulmonary embolism stopped Eliquis  1 month ago/in order to go on nonsteroidal anti-inflammatory medicine for back pain  Developed similar symptoms she did with her left leg as previous DVT Doppler study today shows acute and chronic thrombosis CT angiogram negative  Will reinstitute Eliquis   Amount and/or Complexity of Data Reviewed Labs: ordered. Radiology: ordered. ECG/medicine tests: ordered.  Risk Prescription drug management.          Provider Communication  Current Discharge Medication List     START taking these  medications   Details  apixaban  starter pack 5 mg TBPK Take two tablets (10 mg) by mouth twice a day for the first 7 days, then take one tablet (5 mg) by mouth twice a day for days 8-30. Start with [employer] [facility] on Day 1, then switch to [facility] on Day 15. Qty: 74 each, Refills: 0        Current Discharge Medication List      Current Discharge Medication List     STOP taking these medications     meloxicam (MOBIC) 15 mg tablet Comments:  Reason for Stopping:          Clinical Impression Final diagnoses:  Acute deep vein thrombosis (DVT) of left lower extremity, unspecified vein (*)    ED Disposition     ED Disposition  Discharge   Condition  Stable   Comment  --                   Electronically signed by:       [1] Social History Tobacco Use  Smoking Status Never  . Passive exposure: Never  Smokeless Tobacco Never  [  2] Allergies Allergen Reactions  . Monistat Rash    Labial swelling and burning  . Sulfa Antibiotics Nausea And Vomiting, Nausea Only and Other   Lynwood JAYSON Life, MD 11/05/24 2145

## 2024-11-06 ENCOUNTER — Encounter: Payer: Self-pay | Admitting: Hematology and Oncology

## 2024-11-06 NOTE — Progress Notes (Deleted)
    [facility] [address] [place] [address] Phone: [phone]   Assessment and Plan:     ***    Pertinent previous records reviewed include ***   Follow Up: ***     Subjective:   I, Amy Landry, am serving as a neurosurgeon for Doctor Amy Landry  Chief Complaint: low back pain   HPI:   11/07/2024 Patient is a 38 year old female with low back pain. Patient states   Relevant Historical Information: ***  Additional pertinent review of systems negative.   Current Outpatient Medications:    apixaban  (ELIQUIS ) 5 MG TABS tablet, Take 1 tablet (5 mg total) by mouth 2 (two) times daily., Disp: 180 tablet, Rfl: 2   Budesonide  (UCERIS ) 2 MG/ACT FOAM, Insert 2 mg (1 metered dose) per rectum once daily x 4 weeks (Patient not taking: Reported on 06/11/2024), Disp: 33.4 g, Rfl: 1   cephALEXin (KEFLEX) 500 MG capsule, Take 500 mg by mouth every 6 (six) hours., Disp: , Rfl:    clindamycin (CLEOCIN T) 1 % SWAB, APPLY SPARINGLY TO FACE IN THE MORNING AS DIRECTED, Disp: , Rfl:    fluocinonide cream (LIDEX) 0.05 %, Apply 1 Application topically as needed., Disp: , Rfl:    gabapentin (NEURONTIN) 100 MG capsule, Take by mouth. (Patient not taking: Reported on 06/11/2024), Disp: , Rfl:    HYDROcodone-acetaminophen (NORCO/VICODIN) 5-325 MG tablet, Take 1 tablet by mouth every 6 (six) hours as needed. (Patient not taking: Reported on 06/11/2024), Disp: , Rfl:    mesalamine  (CANASA ) 1000 MG suppository, PLACE 1 SUPPOSITORY (1,000 MG TOTAL) RECTALLY AT BEDTIME. (Patient not taking: Reported on 06/11/2024), Disp: 90 suppository, Rfl: 1   mesalamine  (LIALDA ) 1.2 g EC tablet, Take 4 tablets (4.8 g total) by mouth daily with breakfast. (Patient not taking: Reported on 06/11/2024), Disp: 120 tablet, Rfl: 1   methocarbamol (ROBAXIN) 500 MG tablet, Take 500 mg by mouth as needed. (Patient not taking: Reported on 06/11/2024), Disp: , Rfl:     methocarbamol (ROBAXIN) 750 MG tablet, Take 750 mg by mouth as needed. (Patient not taking: Reported on 06/11/2024), Disp: , Rfl:    Norethindrone-Ethinyl Estradiol-Fe Biphas (LO LOESTRIN FE) 1 MG-10 MCG / 10 MCG tablet, Take 1 tablet by mouth daily., Disp: , Rfl:    spironolactone (ALDACTONE) 50 MG tablet, Take 50 mg by mouth 2 (two) times daily., Disp: , Rfl:    traZODone (DESYREL) 50 MG tablet, Take 50 mg by mouth at bedtime as needed., Disp: , Rfl:    tretinoin (RETIN-A) 0.1 % cream, Apply 1 application topically at bedtime., Disp: , Rfl:    triamcinolone ointment (KENALOG) 0.1 %, Apply 1 Application topically as needed. (Patient not taking: Reported on 06/11/2024), Disp: , Rfl:    valACYclovir (VALTREX) 500 MG tablet, Take 500 mg by mouth as needed., Disp: , Rfl:    Vedolizumab  (ENTYVIO  PEN) 108 MG/0.68ML SOAJ, Inject 1 Pen into the skin every 14 (fourteen) days., Disp: 1.36 mL, Rfl: 5   zolpidem (AMBIEN) 10 MG tablet, Take 10 mg by mouth at bedtime as needed for sleep., Disp: , Rfl:    Objective:     There were no vitals filed for this visit.    There is no height or weight on file to calculate BMI.    Physical Exam:    ***   Electronically signed by:  [facility] 7:32 AM 11/06/24

## 2024-11-07 ENCOUNTER — Ambulatory Visit: Admitting: Sports Medicine

## 2024-11-07 ENCOUNTER — Inpatient Hospital Stay: Attending: Hematology and Oncology | Admitting: Hematology and Oncology

## 2024-11-07 VITALS — BP 130/83 | HR 81 | Temp 98.6°F | Resp 17 | Wt 137.3 lb

## 2024-11-07 DIAGNOSIS — Z1509 Genetic susceptibility to other malignant neoplasm: Secondary | ICD-10-CM

## 2024-11-07 DIAGNOSIS — I2694 Multiple subsegmental pulmonary emboli without acute cor pulmonale: Secondary | ICD-10-CM | POA: Diagnosis not present

## 2024-11-07 DIAGNOSIS — Z1501 Genetic susceptibility to malignant neoplasm of breast: Secondary | ICD-10-CM

## 2024-11-07 DIAGNOSIS — Z1589 Genetic susceptibility to other disease: Secondary | ICD-10-CM | POA: Diagnosis not present

## 2024-11-07 MED ORDER — APIXABAN 5 MG PO TABS: 5.0000 mg | ORAL_TABLET | Freq: Two times a day (BID) | ORAL | 2 refills | Status: AC

## 2024-11-07 NOTE — Progress Notes (Signed)
 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 No pulmonary embolism.  - Continue Eliquis  loading dose, transition to 5 mg twice daily. - Avoid lower body workouts for six weeks; upper body workouts allowed. - Wear compression socks at work. - Sent Eliquis  prescription through [facility], 90-day supply, two refills. - Continue anticoagulation indefinitely.   No orders of the defined types were placed in this encounter.    HISTORY OF PRESENTING ILLNESS:   Amy Landry 38 y.o. female is here because of ATM mutation.   Discussed the use of AI scribe software for clinical note transcription with the patient, who gave verbal consent to proceed.  History of Present Illness   Amy Landry is a 38 year old female with deep vein thrombosis who presents with recurrence of DVT in the popliteal vein.  She initially experienced what she thought was a muscle cramp in her legs, which later was identified as a new clot in the popliteal vein below the knee. Swelling and soreness in her legs prompted her to contact her previous doctor, leading to the confirmation of a popliteal vein DVT.  She has been feeling fatigued over the past week, initially attributing it to starting a new job and adjusting to new hours. However, she recalls experiencing similar fatigue before her previous DVT diagnosis.  She has a history of clotting issues and is concerned about a potential clotting disorder. She inquired about a gene mutation but was informed that the ATM gene mutation she carries is related to breast  cancer risk, not clotting.  She reports ongoing back pain, which she is investigating with a back specialist. The pain has improved slightly since she stopped certain physical activities, such as posing that involved arching her back excessively.  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 She is concerned about returning to work too soon but is eager to resume her duties.  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    SURGICAL HISTORY:  Bilateral breast augmentation  SOCIAL HISTORY: Social History   Socioeconomic History   Marital status: Single    Spouse name: Not on file   Number of children: 0   Years of education: Not on file   Highest education level: Not on file  Occupational History   Occupation: Respiratory therapist  Tobacco Use   Smoking status: Never   Smokeless tobacco: Never  Vaping Use   Vaping status: Never Used  Substance and Sexual Activity   Alcohol use: Yes    Comment: 1 per week   Drug use:  Never   Sexual activity: Not on file  Other Topics Concern   Not on file  Social History Narrative   Not on file   Social Drivers of Health   Financial Resource Strain: Low Risk  (02/18/2024)   Received from [facility]   Overall Financial Resource Strain (CARDIA)    Difficulty of Paying Living Expenses: Not hard at all  Food Insecurity: No Food Insecurity (03/21/2024)   Received from [facility]   Hunger Vital Sign    Within the past 12 months, you worried that your  food would run out before you got the money to buy more.: Never true    Within the past 12 months, the food you bought just didn't last and you didn't have money to get more.: Never true  Transportation Needs: No Transportation Needs (03/21/2024)   Received from [facility] - Transportation    Lack of Transportation (Medical): No    Lack of Transportation (Non-Medical): No  Physical Activity: Sufficiently Active (02/18/2024)   Received from [facility]   Exercise Vital Sign    On average, how many days per week do you engage in moderate to strenuous exercise (like a brisk walk)?: 5 days    On average, how many minutes do you engage in exercise at this level?: 60 min  Stress: No Stress Concern Present (03/21/2024)   Received from [facility] - Occupational Stress Questionnaire    Feeling of Stress : Not at all  Recent Concern: Stress - Stress Concern Present (02/18/2024)   Received from [facility] - Occupational Stress Questionnaire    Feeling of Stress : To some extent  Social Connections: Socially Integrated (02/18/2024)   Received from [facility]   Social Network    How would you rate your social network (family, work, friends)?: Good participation with social networks  Intimate Partner Violence: Not At Risk (11/05/2024)   Received from [facility]   HITS    Over the last 12 months how often did your partner physically hurt you?: Never    Over the last 12 months how often did your partner insult you or talk down to you?: Never    Over the last 12 months how often did your partner threaten you with physical harm?: Never    Over the last 12 months how often did your partner scream or curse at you?: Never    FAMILY HISTORY: Family History  Problem Relation Age of Onset   Hypertension Mother    Crohn's disease Mother    Hypercholesterolemia Father    Hypertension Father    Heart  disease Father    Diabetes Father    Melanoma Father    Esophageal cancer Father    Colon cancer Maternal Uncle    Breast cancer Paternal Aunt    Uterine cancer Maternal Grandmother    Breast cancer Maternal Grandmother    Lung cancer Maternal Grandmother    Heart disease Maternal Grandfather    Breast cancer Paternal Grandmother    Osteoporosis Paternal Grandmother    CVA Paternal Grandfather    Diabetes Paternal Grandfather    Dementia Paternal Grandfather    Rectal cancer Neg Hx    Stomach cancer Neg Hx     ALLERGIES:  is allergic to miconazole and sulfa antibiotics.  MEDICATIONS:  Current Outpatient Medications  Medication Sig Dispense Refill   apixaban  (ELIQUIS ) 5 MG TABS  tablet Take 1 tablet (5 mg total) by mouth 2 (two) times daily. 180 tablet 2   clindamycin (CLEOCIN T) 1 % SWAB APPLY SPARINGLY TO FACE IN THE MORNING AS DIRECTED     fluocinonide cream (LIDEX) 0.05 % Apply 1 Application topically as needed.     spironolactone (ALDACTONE) 50 MG tablet Take 50 mg by mouth 2 (two) times daily.     traZODone (DESYREL) 50 MG tablet Take 50 mg by mouth at bedtime as needed.     tretinoin (RETIN-A) 0.1 % cream Apply 1 application topically at bedtime.     valACYclovir (VALTREX) 500 MG tablet Take 500 mg by mouth as needed.     Vedolizumab  (ENTYVIO  PEN) 108 MG/0.68ML SOAJ Inject 1 Pen into the skin every 14 (fourteen) days. 1.36 mL 5   zolpidem (AMBIEN) 10 MG tablet Take 10 mg by mouth at bedtime as needed for sleep.     No current facility-administered medications for this visit.   PHYSICAL EXAMINATION: ECOG PERFORMANCE STATUS: 0 - Asymptomatic  Vitals:   11/07/24 1025  BP: 130/83  Pulse: 81  Resp: 17  Temp: 98.6 F (37 C)  SpO2: 100%       Filed Weights   11/07/24 1025  Weight: 137 lb 4.8 oz (62.3 kg)     GENERAL:alert, no distress and comfortable   LABORATORY DATA:  I have reviewed the data as listed Lab Results  Component Value Date   WBC 8.0  10/31/2022   HGB 13.6 10/31/2022   HCT 39.2 10/31/2022   MCV 93.9 10/31/2022   PLT 318.0 10/31/2022     Chemistry      Component Value Date/Time   NA 136 01/25/2023 1408   NA 133 (L) 03/14/2015 1127   K 3.9 01/25/2023 1408   K 4.2 03/14/2015 1127   CL 101 01/25/2023 1408   CL 101 03/14/2015 1127   CO2 25 01/25/2023 1408   CO2 25 03/14/2015 1127   BUN 17 01/25/2023 1408   BUN 16 03/14/2015 1127   CREATININE 1.08 01/25/2023 1408   CREATININE 0.91 03/14/2015 1127      Component Value Date/Time   CALCIUM 9.3 01/25/2023 1408   CALCIUM 8.7 (L) 03/14/2015 1127   ALKPHOS 56 06/11/2024 1642   ALKPHOS 83 03/14/2015 1127   AST 20 06/11/2024 1642   AST 61 (H) 03/14/2015 1127   ALT 19 06/11/2024 1642   ALT 63 (H) 03/14/2015 1127   BILITOT 0.7 06/11/2024 1642   BILITOT 0.5 03/14/2015 1127      RADIOGRAPHIC STUDIES: I have personally reviewed the radiological images as listed and agreed with the findings in the report. No results found.     Amber Stalls, MD 11/07/2024 10:36 AM

## 2024-11-07 NOTE — Progress Notes (Unsigned)
 [name] [address] [place] [address] Phone: [phone]   Assessment and Plan:     1. Chronic right-sided low back pain with right-sided sciatica (Primary) 2. Degeneration of intervertebral disc of lumbar region with discogenic back pain and lower extremity pain -Chronic with exacerbation, initial sports medicine visit - Most consistent with lumbar DDD at L5-S1 leading to localized pain and right-sided radicular symptoms.  History of herniated disc at L5-S1 15+ years ago per patient - Recommend further evaluation with lumbar MRI due to intermittent weakness and right calf/lower extremity, chronic pain that has not improved with conservative therapy, pain with day-to-day activities, pain >6/10, degenerative changes on x-ray - X-ray obtained in clinic.  My interpretation: No acute fracture or dislocation.  Significantly decreased disc space at L5-S1 with localized degenerative changes - Do not recommend p.o. NSAIDs due to current anticoagulation on Eliquis  and history of DVT, PE - Patient has tried 2 courses of prednisone with no significant relief of symptoms.  Do not recommend repeating prednisone course at this time - Start HEP and physical therapy for low back and core  15 additional minutes spent for educating Therapeutic Home Exercise Program.  This included exercises focusing on stretching, strengthening, with focus on eccentric aspects.   Long term goals include an improvement in range of motion, strength, endurance as well as avoiding reinjury. Patient's frequency would include in 1-2 times a day, 3-5 times a week for a duration of 6-12 weeks. Proper technique shown and discussed handout in great detail with ATC.  All questions were discussed and answered.      Pertinent previous records reviewed include family medicine note 10/22/2024   Follow Up: 1 week after MRI to review results.  Could discuss epidural CSI.  May have  to delay epidural CSI while undergoing workup and treatment for DVT, PE.   Subjective:   I, Dia Jefferys Maybell, am serving as a neurosurgeon for Doctor Morene Mace  Chief Complaint: low back pain   HPI:   11/12/2024 Patient is a 38 year old female with low back pain. Patient states pain 4-5 months . Hx of herniated disc L5-S1. Pain radiates down the leg to the calf (weakness and pinching). 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 Hx of left leg DVT and PE. Tylenol did not help. Meloxicam has helped but she ended up in the ED. Muscle relaxer helps a little. No numbness or tingling. Toradol helped a lot. Prednisone helped some. Has seen chiro. Pain is right side low back    Relevant Historical Information: History of DVT, PE currently on anticoagulation with Eliquis   Additional pertinent review of systems negative.   Current Outpatient Medications:    apixaban  (ELIQUIS ) 5 MG TABS tablet, Take 1 tablet (5 mg total) by mouth 2 (two) times daily., Disp: 180 tablet, Rfl: 2   clindamycin (CLEOCIN T) 1 % SWAB, APPLY SPARINGLY TO FACE IN THE MORNING AS DIRECTED, Disp: , Rfl:    fluocinonide cream (LIDEX) 0.05 %, Apply 1 Application topically as needed., Disp: , Rfl:    spironolactone (ALDACTONE) 50 MG tablet, Take 50 mg by mouth 2 (two) times daily., Disp: , Rfl:    traZODone (DESYREL) 50 MG tablet, Take 50 mg by mouth at bedtime as needed., Disp: , Rfl:    tretinoin (RETIN-A) 0.1 % cream, Apply 1 application topically at bedtime., Disp: , Rfl:    valACYclovir (VALTREX) 500 MG  tablet, Take 500 mg by mouth as needed., Disp: , Rfl:    Vedolizumab  (ENTYVIO  PEN) 108 MG/0.68ML SOAJ, Inject 1 Pen into the skin every 14 (fourteen) days., Disp: 1.36 mL, Rfl: 5   zolpidem (AMBIEN) 10 MG tablet, Take 10 mg by mouth at bedtime as needed for sleep., Disp: , Rfl:    Objective:     Vitals:   11/12/24 1442  BP: 118/82  Pulse: 72  SpO2: 99%  Weight: 137 lb  (62.1 kg)  Height: 5' 5 (1.651 m)      Body mass index is 22.8 kg/m.    Physical Exam:    Gen: Appears well, nad, nontoxic and pleasant Psych: Alert and oriented, appropriate mood and affect Neuro: sensation intact, strength is 5/5 in upper and lower extremities, muscle tone wnl Skin: no susupicious lesions or rashes  Back - Normal skin, Spine with normal alignment and no deformity.   No tenderness to vertebral process palpation.   Paraspinous muscles are not tender and without spasm NTTP gluteal musculature Straight leg raise positive right Trendelenberg positive right Positive stork test on right Piriformis Test negative Gait normal Right sided low back pain worsened by lumbar flexion, extension.  No pain with rotation   Electronically signed by:  [name] [name] 3:08 PM 11/12/24

## 2024-11-12 ENCOUNTER — Ambulatory Visit: Admitting: Sports Medicine

## 2024-11-12 ENCOUNTER — Ambulatory Visit

## 2024-11-12 VITALS — BP 118/82 | HR 72 | Ht 65.0 in | Wt 137.0 lb

## 2024-11-12 DIAGNOSIS — G8929 Other chronic pain: Secondary | ICD-10-CM

## 2024-11-12 DIAGNOSIS — M51362 Other intervertebral disc degeneration, lumbar region with discogenic back pain and lower extremity pain: Secondary | ICD-10-CM | POA: Diagnosis not present

## 2024-11-12 DIAGNOSIS — M545 Low back pain, unspecified: Secondary | ICD-10-CM | POA: Diagnosis not present

## 2024-11-12 NOTE — Patient Instructions (Addendum)
 Low back HEP   PT referral   Tylenol [phone] mg 2-3 times a day for pain relief   MRI referral   Follow up 1 week after MRI

## 2024-11-14 ENCOUNTER — Encounter: Payer: Self-pay | Admitting: Hematology and Oncology

## 2024-11-17 ENCOUNTER — Ambulatory Visit

## 2024-11-17 DIAGNOSIS — G8929 Other chronic pain: Secondary | ICD-10-CM

## 2024-11-17 DIAGNOSIS — M51362 Other intervertebral disc degeneration, lumbar region with discogenic back pain and lower extremity pain: Secondary | ICD-10-CM

## 2024-11-24 ENCOUNTER — Ambulatory Visit: Payer: Self-pay | Admitting: Sports Medicine

## 2024-11-25 ENCOUNTER — Ambulatory Visit: Admitting: Physical Therapy

## 2024-11-25 ENCOUNTER — Encounter

## 2024-11-25 ENCOUNTER — Ambulatory Visit: Admitting: Sports Medicine

## 2024-11-25 VITALS — HR 80 | Ht 65.0 in | Wt 135.0 lb

## 2024-11-25 DIAGNOSIS — M51362 Other intervertebral disc degeneration, lumbar region with discogenic back pain and lower extremity pain: Secondary | ICD-10-CM

## 2024-11-25 DIAGNOSIS — G8929 Other chronic pain: Secondary | ICD-10-CM | POA: Diagnosis not present

## 2024-11-25 NOTE — Progress Notes (Signed)
 Ben Macky Galik D.[name] [address] [place] [address] Phone: [phone]   Assessment and Plan:     1. Chronic right-sided low back pain with right-sided sciatica (Primary) 2. Degeneration of intervertebral disc of lumbar region with discogenic back pain and lower extremity pain -Chronic with exacerbation, subsequent visit - Large L5-S1 disc herniation seen on lumbar MRI causing patient's low back pain and right sided radicular symptoms - Recommend epidural CSI to right sided L5-S1.  Order placed - Do not recommend p.o. NSAIDs due to current anticoagulation on Eliquis  with history of DVT, PE - Continue gentle HEP.  Hematology oncology recommended no significant lower extremity activity, so we will delay physical therapy until January 2026 -Patient has recently completed 2 prednisone courses without significant relief of symptoms.  Do not recommend repeating prednisone at this time  Pertinent previous records reviewed include lumbar MRI   Follow Up: 2 weeks after epidural to review benefit.  Could consider repeat epidural if necessary   Subjective:   I, Moenique Parris, am serving as a neurosurgeon for Doctor Morene Mace   Chief Complaint: low back pain    HPI:    11/12/2024 Patient is a 38 year old female with low back pain. Patient states pain 4-5 months . Hx of herniated disc L5-S1. Pain radiates down the leg to the calf (weakness and pinching). 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 Hx of left leg DVT and PE. Tylenol did not help. Meloxicam has helped but she ended up in the ED. Muscle relaxer helps a little. No numbness or tingling. Toradol helped a lot. Prednisone helped some. Has seen chiro. Pain is right side low back    11/26/2024 Patient states she is the same . Meloxicam helped take the edge off   Relevant Historical Information: History of DVT, PE currently on  anticoagulation with Eliquis   Additional pertinent review of systems negative.   Current Outpatient Medications:    apixaban  (ELIQUIS ) 5 MG TABS tablet, Take 1 tablet (5 mg total) by mouth 2 (two) times daily., Disp: 180 tablet, Rfl: 2   clindamycin (CLEOCIN T) 1 % SWAB, APPLY SPARINGLY TO FACE IN THE MORNING AS DIRECTED, Disp: , Rfl:    fluocinonide cream (LIDEX) 0.05 %, Apply 1 Application topically as needed., Disp: , Rfl:    spironolactone (ALDACTONE) 50 MG tablet, Take 50 mg by mouth 2 (two) times daily., Disp: , Rfl:    traZODone (DESYREL) 50 MG tablet, Take 50 mg by mouth at bedtime as needed., Disp: , Rfl:    tretinoin (RETIN-A) 0.1 % cream, Apply 1 application topically at bedtime., Disp: , Rfl:    valACYclovir (VALTREX) 500 MG tablet, Take 500 mg by mouth as needed., Disp: , Rfl:    Vedolizumab  (ENTYVIO  PEN) 108 MG/0.68ML SOAJ, Inject 1 Pen into the skin every 14 (fourteen) days., Disp: 1.36 mL, Rfl: 5   zolpidem (AMBIEN) 10 MG tablet, Take 10 mg by mouth at bedtime as needed for sleep., Disp: , Rfl:    Objective:     Vitals:   11/25/24 1517  Pulse: 80  SpO2: 100%  Weight: 135 lb (61.2 kg)  Height: 5' 5 (1.651 m)      Body mass index is 22.47 kg/m.    Physical Exam:    Gen: Appears well, nad, nontoxic and pleasant Psych: Alert and oriented, appropriate mood and affect Neuro: sensation intact, strength is 5/5 in upper and  lower extremities, muscle tone wnl Skin: no susupicious lesions or rashes   Back - Normal skin, Spine with normal alignment and no deformity.   No tenderness to vertebral process palpation.   Paraspinous muscles are not tender and without spasm NTTP gluteal musculature Straight leg raise positive right Trendelenberg positive right Positive stork test on right Piriformis Test negative Gait normal Right sided low back pain worsened by lumbar flexion, extension.  No pain with rotation    Electronically signed by:  [name] [name] 4:15 PM 11/25/24

## 2024-11-25 NOTE — Patient Instructions (Signed)
 Epidural L5-S1 right   Follow up 2 weeks after   Start PT in January

## 2024-11-27 ENCOUNTER — Ambulatory Visit: Admitting: Sports Medicine

## 2024-12-01 ENCOUNTER — Other Ambulatory Visit: Payer: Self-pay | Admitting: Sports Medicine

## 2024-12-01 DIAGNOSIS — M51362 Other intervertebral disc degeneration, lumbar region with discogenic back pain and lower extremity pain: Secondary | ICD-10-CM

## 2024-12-01 DIAGNOSIS — G8929 Other chronic pain: Secondary | ICD-10-CM

## 2024-12-01 MED ORDER — TRAMADOL HCL 50 MG PO TABS: 50.0000 mg | ORAL_TABLET | Freq: Three times a day (TID) | ORAL | 0 refills | Status: AC | PRN

## 2024-12-01 NOTE — Progress Notes (Signed)
 Tramadol  50 mg 3 times daily as needed sent to pharmacy for severe pain   breakthrough

## 2024-12-03 ENCOUNTER — Telehealth: Admitting: Hematology and Oncology

## 2024-12-04 ENCOUNTER — Ambulatory Visit: Admitting: Physical Therapy

## 2024-12-15 ENCOUNTER — Telehealth: Payer: Self-pay

## 2024-12-15 NOTE — Telephone Encounter (Signed)
 Amy Landry let our office know that per patients cancer doctor they are not going to clear her to holding the Eliquis  due to recetn blood clot . patient is aware and we will not be able to do epidural until she is able to hold thinner . 8 mins    Morene Mace, DO  Thank you for the heads up. Ashley, could you please help me get patient back on my schedule to discuss alternative treatments. Ok to be virtual visit if patient wishes.

## 2024-12-17 ENCOUNTER — Encounter: Payer: Self-pay | Admitting: Hematology and Oncology

## 2024-12-23 ENCOUNTER — Encounter: Payer: Self-pay | Admitting: Physical Therapy

## 2024-12-23 ENCOUNTER — Ambulatory Visit: Attending: Adult Health Nurse Practitioner | Admitting: Physical Therapy

## 2024-12-23 ENCOUNTER — Other Ambulatory Visit: Payer: Self-pay

## 2024-12-23 DIAGNOSIS — M6281 Muscle weakness (generalized): Secondary | ICD-10-CM | POA: Insufficient documentation

## 2024-12-23 DIAGNOSIS — M51362 Other intervertebral disc degeneration, lumbar region with discogenic back pain and lower extremity pain: Secondary | ICD-10-CM | POA: Insufficient documentation

## 2024-12-23 DIAGNOSIS — M5441 Lumbago with sciatica, right side: Secondary | ICD-10-CM | POA: Diagnosis present

## 2024-12-23 DIAGNOSIS — M5459 Other low back pain: Secondary | ICD-10-CM | POA: Insufficient documentation

## 2024-12-23 DIAGNOSIS — G8929 Other chronic pain: Secondary | ICD-10-CM | POA: Diagnosis not present

## 2024-12-23 NOTE — Therapy (Signed)
 " OUTPATIENT PHYSICAL THERAPY THORACOLUMBAR EVALUATION   Patient Name: Amy Landry MRN: [id number] DOB:November 20, 1986, 39 y.o., female Today's Date: 12/23/2024  END OF SESSION:  PT End of Session - 12/23/24 1619     Visit Number 1    Number of Visits 13    Date for Recertification  02/03/25    Authorization Type [employer]    PT Start Time 1619    PT Stop Time 1704    PT Time Calculation (min) 45 min          Past Medical History:  Diagnosis Date   Abnormal Pap smear of cervix    LEEP    Anxiety    Ataxia telangiectasia (ATM) (HCC)    Carrier of genetic disorder    high risk cancer mutation gene - high risk for breast and pancreatic cancer   Depression    DVT (deep venous thrombosis) (HCC)    Eczema    History of kidney stones    IBD (inflammatory bowel disease)    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   Past Surgical History:  Procedure Laterality Date   AUGMENTATION MAMMAPLASTY Bilateral 07/2010   CERVICAL BIOPSY  W/ LOOP ELECTRODE EXCISION  2014   PLACEMENT OF BREAST IMPLANTS  07/2010   TUBAL LIGATION  08/14/2022   WISDOM TOOTH EXTRACTION     Patient Active Problem List   Diagnosis Date Noted   DVT of lower extremity (deep venous thrombosis) (HCC) 03/26/2024   Pulmonary embolism (HCC) 03/26/2024   Ulcerative proctitis with rectal bleeding (HCC) 03/11/2024   Rectal bleeding 10/31/2022   Monoallelic mutation of ATM gene 95/85/7977    PCP: [name], NP  REFERRING PROVIDER: Leonce Katz, DO  REFERRING DIAG: [phone] (ICD-10-CM) - Chronic right-sided low back pain with right-sided sciatica M51.362 (ICD-10-CM) - Degeneration of intervertebral disc of lumbar region with discogenic back pain and lower extremity pain  Rationale for Evaluation and Treatment: Rehabilitation  THERAPY DIAG:  Other low back pain  Muscle weakness (generalized)  ONSET DATE: summer 2025  SUBJECTIVE:                                                                                                                                                                                            SUBJECTIVE STATEMENT: Pt states she is an avid weightlifter, competitive bodybuilder - doesn't recall any specific injury but one morning woke up with back feeling tweaky. Was training for competition over the fall, but is now in off season and only training upper body. Symptoms have been pretty consistent, not worsening. Has tried chiropractor but has since deferred this. Reports past history of L5-S1  disc herniation in 2009, felt better with PT. States this current episode feels different. Has tried some core exercises with some improvement. Usually a belly or side sleeper but difficulty tolerating this right now. Reports good compliance w/ eliquis , recent DVT LLE. Denies N/T, does report sensation of weakness in R calf since these symptoms began. Denies bowel/bladder symptoms or saddle anesthesia. No L sided symptoms.    PERTINENT HISTORY:  hx DVT/PE, ataxia telangiectasia Recent LLE DVT on eliquis   PAIN:  Are you having pain: 6-7/10 Location/description: R low back, weakness in calf, pinching upper glute, radiates laterally Best-worst over past week: 4-7/10  - aggravating factors: posing (extension), transfers, prolonged sitting, lying flat, lower body dressing - Easing factors: sitting, lower body exercises   PRECAUTIONS: hx PE/DVT; on eliquis  Per 11/07/24 hem/onc note: Avoid lower body workouts for six weeks; upper body workouts allowed.  RED FLAGS: None   WEIGHT BEARING RESTRICTIONS: No  FALLS:  Has patient fallen in last 6 months? 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  DIAGNOSTIC FINDINGS:   Lumbar MRI 11/17/24: IMPRESSION: 1. Central and right paramedian disc extrusion at L5-S1 causing moderate right subarticular narrowing with likely right S1 nerve root impingement, and mild left subarticular narrowing. The foramina are patent. 2. Mild disc bulge at L2-3 with rightward protrusion and far-right annular tear without focal stenosis.  PATIENT SURVEYS:  PSFS:  - lying down on back 7  - lower body dressing 5  - sit to stand 4    - total 16 ; avg 5.33      COGNITION: Overall cognitive status: Within functional limits for tasks assessed   SENSATION/NEURO: Light touch intact BIL LE   No ataxia with gait     LUMBAR ROM:   AROM eval  Flexion Mid shin  Extension 100%  Right lateral flexion knee  Left lateral flexion knee  Right rotation 75% *  Left rotation 100%   (Blank rows = not tested) (Key: WFL = within functional limits not formally assessed, * = concordant pain, s = stiffness/stretching sensation, NT = not tested) Comment: rotational pain worse with sitting compared to standing  LOWER EXTREMITY ROM:      Right eval Left eval  Hip flexion    Hip extension    Hip internal rotation    Hip external rotation    Knee extension    Knee flexion    (Blank rows = not tested) (Key: WFL = within functional limits not formally assessed, * = concordant pain, s = stiffness/stretching sensation, NT = not tested)  Comments: pain with active hip flexion, less so passively. Pain with passive ER (RLE)  LOWER EXTREMITY MMT:    MMT Right eval Left eval  Hip flexion 3+ * 4+  Hip abduction (modified sitting)    Hip internal rotation 4+ 4+  Hip external rotation 4 * 4+  Knee flexion 4 * 4+  Knee extension 4+ * 4+  Ankle dorsiflexion     (Blank rows = not tested) (Key: WFL = within functional limits not formally assessed, * = concordant pain, s = stiffness/stretching sensation, NT = not tested)  Comments:    LUMBAR SPECIAL TESTS:   FABER: positive  RLE  FADDIR: negative  RLE  Relief with short axis distraction RLE  Concordant pain with AP hip mob RLE  FUNCTIONAL TESTS:  5xSTS: 13.02sec no UE support, mild pain, guarded posture, mild shift to L No symptoms with SLS  GAIT: Distance walked: within clinic Assistive device utilized: None Level of assistance: Complete Independence Comments: guarded posture, reduced trunk rotation and   TREATMENT:  [facility] Adult PT Treatment:                                                DATE: 12/23/24 Therapeutic Exercise: HEP practice reps, handout + education (with sciatic nerve glides, modified for RLE resting on ground, knee slightly flexed, focusing on PF/DF only)  Self Care: Education/discussion re: exam findings as they relate to symptom behavior, encouraging introductory walking program, activity modification as indicated, sleep positioning, relevant anatomy/physiology and rationale for interventions                                                                                                                                PATIENT EDUCATION:  Education details: Pt education on PT impairments, prognosis, and POC. Informed consent. Rationale for interventions, safe/appropriate HEP performance Person educated: Patient Education method: Explanation, Demonstration, Tactile cues, Verbal cues Education comprehension: verbalized understanding, returned demonstration, verbal cues required, tactile cues required, and needs further education    HOME EXERCISE PROGRAM: Access Code: [email or web address] URL: [email or web address] Date: 12/23/2024 Prepared by: Alm Jenny  Exercises - Seated Sciatic Tensioner  - 2-3 x daily - 1 sets - 8-10 reps - Sit to Stand  - 2-3 x daily - 1 sets - 8 reps - Seated Hip Adduction Isometrics with Ball  - 2-3 x daily - 1 sets - 10-15 reps  ASSESSMENT:  CLINICAL IMPRESSION: Patient is a pleasant 39 y.o. woman who was seen today for physical therapy evaluation and  treatment for low back/LE pain. Start of PT delayed per hem/onc recommendations to avoid lower body workouts for 6 weeks (refer to 11/07/24 documentation in EPIC). Endorses difficulty w/ static standing, STS transfers (especially after prolonged sitting), lying flat, and lower body ADLs. Red flag questioning reassuring today. On exam she demonstrates concordant limitations in lumbar and R hip mobility, RLE strength (limited by pain rather than weakness), transfer mechanics. 5xSTS outside of age norm (MCID of 2.3sec, age cohort norm 6.2+/-1.3sec per Easter et al 2007) and around cutoff score for fall risk (generally 12-14 sec). Some initial symptom irritability with trial of STS for HEP but improves w/ repetition, does well with nerve glides. No adverse events, reports baseline symptoms on departure. Recommend trial of skilled PT to address aforementioned deficits with aim of improving functional tolerance and reducing pain with typical activities. Pt departs today's session in no acute distress, all voiced concerns/questions addressed appropriately from PT perspective.     OBJECTIVE  IMPAIRMENTS: decreased activity tolerance, decreased endurance, decreased mobility, difficulty walking, decreased ROM, decreased strength, impaired perceived functional ability, improper body mechanics, and pain.   ACTIVITY LIMITATIONS: bending, sitting, standing, squatting, sleeping, transfers, and locomotion level  PARTICIPATION LIMITATIONS: community activity and occupation  PERSONAL FACTORS: Time since onset of injury/illness/exacerbation and 1-2 comorbidities: hx DVT/PE, ataxia telangiectasia are also affecting patient's functional outcome.   REHAB POTENTIAL: Good  CLINICAL DECISION MAKING: Stable/uncomplicated  EVALUATION COMPLEXITY: Low   GOALS:  SHORT TERM GOALS: Target date: 01/13/2025  Pt will demonstrate appropriate understanding and performance of initially prescribed HEP in order to facilitate improved  independence with management of symptoms.  Baseline: HEP established  Goal status: INITIAL   2. Pt will report at least 25% improvement in overall pain levels over past week in order to facilitate improved tolerance to typical daily activities.   Baseline: 4-7/10  Goal status: INITIAL    LONG TERM GOALS: Target date: 02/03/2025 Pt will improve avg score to at least 8 on PSFS in order to demonstrate improved perception of functional status due to symptoms.  Baseline: 5.33 Goal status: INITIAL  2.  Pt will demonstrate symmetrical and painless lumbar rotation AROM in order to demonstrate improved tolerance to functional movement patterns.  Baseline: see ROM chart above Goal status: INITIAL  3.  Pt will demonstrate RLE MMT of at least 4+/5 throughout in tested groups n order to demonstrate improved strength for functional movements.  Baseline: see MMT chart above Goal status: INITIAL  4. Pt will perform 5xSTS in </= 8 sec in order to demonstrate reduced fall risk and improved functional independence. (MCID of 2.3sec)  Baseline: 13sec no UE support  Goal status: INITIAL   5. Pt will report no more than 2 pt increase in pain with lower body ADLs in past week for improved tolerance to daily tasks.  Baseline: increased pain/difficulty w/ donning shoes and socks  Goal status: INITIAL  6. Pt will report at least 50% decrease in overall pain levels in past week in order to facilitate improved tolerance to basic ADLs/mobility.   Baseline: 4-7/10  Goal status: INITIAL    PLAN:  PT FREQUENCY: 1-2x/week  PT DURATION: 6 weeks  PLANNED INTERVENTIONS: 97164- PT Re-evaluation, 97750- Physical Performance Testing, 97110-Therapeutic exercises, 97530- Therapeutic activity, [id number]- Neuromuscular re-education, 97535- Self Care, [id number]- Manual therapy, [phone]- Gait training, [phone]- Aquatic Therapy, Patient/Family education, Balance training, Stair training, Taping, Joint mobilization, Spinal mobilization,  Cryotherapy, and Moist heat.  PLAN FOR NEXT SESSION: Review/update HEP PRN. Work on [employer] exercises as appropriate with emphasis on core/hip stability, STS blocked practice, walking program, nerve glides. Consider utilization of hip distraction and soft tissue work glutes/lumbar spine for pain modification if indicated. Symptom modification strategies as indicated/appropriate. Mindful of recent DVT and PE hx, on eliquis    Alm DELENA Jenny PT, DPT 12/23/2024 5:39 PM  "

## 2024-12-29 ENCOUNTER — Ambulatory Visit

## 2024-12-29 NOTE — Progress Notes (Unsigned)
"             ° °   [name] [address] [place] [address] Phone: [phone]   Assessment and Plan:     ***    Pertinent previous records reviewed include ***   Follow Up: ***     Subjective:   I, Stony Stegmann, am serving as a neurosurgeon for Doctor Morene Mace   Chief Complaint: low back pain    HPI:    11/12/2024 Patient is a 39 year old female with low back pain. Patient states pain 4-5 months . Hx of herniated disc L5-S1. Pain radiates down the leg to the calf (weakness and pinching). 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 Hx of left leg DVT and PE. Tylenol did not help. Meloxicam has helped but she ended up in the ED. Muscle relaxer helps a little. No numbness or tingling. Toradol helped a lot. Prednisone helped some. Has seen chiro. Pain is right side low back    11/26/2024 Patient states she is the same . Meloxicam helped take the edge off   12/30/2024 Patient states   Relevant Historical Information: History of DVT, PE currently on anticoagulation with Eliquis   Additional pertinent review of systems negative.  Current Medications[1]   Objective:     There were no vitals filed for this visit.    There is no height or weight on file to calculate BMI.    Physical Exam:    ***   Electronically signed by:  [name] D[name] 12:26 PM 12/29/2024    [1]  Current Outpatient Medications:    apixaban  (ELIQUIS ) 5 MG TABS tablet, Take 1 tablet (5 mg total) by mouth 2 (two) times daily., Disp: 180 tablet, Rfl: 2   clindamycin (CLEOCIN T) 1 % SWAB, APPLY SPARINGLY TO FACE IN THE MORNING AS DIRECTED, Disp: , Rfl:    fluocinonide cream (LIDEX) 0.05 %, Apply 1 Application topically as needed., Disp: , Rfl:    spironolactone (ALDACTONE) 50 MG tablet, Take 50 mg by mouth 2 (two) times daily., Disp: , Rfl:    traZODone (DESYREL) 50 MG tablet,  Take 50 mg by mouth at bedtime as needed., Disp: , Rfl:    tretinoin (RETIN-A) 0.1 % cream, Apply 1 application topically at bedtime., Disp: , Rfl:    valACYclovir (VALTREX) 500 MG tablet, Take 500 mg by mouth as needed., Disp: , Rfl:    Vedolizumab  (ENTYVIO  PEN) 108 MG/0.68ML SOAJ, Inject 1 Pen into the skin every 14 (fourteen) days., Disp: 1.36 mL, Rfl: 5   zolpidem (AMBIEN) 10 MG tablet, Take 10 mg by mouth at bedtime as needed for sleep., Disp: , Rfl:   "

## 2024-12-30 ENCOUNTER — Telehealth: Admitting: Sports Medicine

## 2024-12-30 ENCOUNTER — Ambulatory Visit

## 2024-12-30 DIAGNOSIS — M6281 Muscle weakness (generalized): Secondary | ICD-10-CM

## 2024-12-30 DIAGNOSIS — M5441 Lumbago with sciatica, right side: Secondary | ICD-10-CM | POA: Diagnosis not present

## 2024-12-30 DIAGNOSIS — M5459 Other low back pain: Secondary | ICD-10-CM

## 2024-12-30 DIAGNOSIS — M5126 Other intervertebral disc displacement, lumbar region: Secondary | ICD-10-CM | POA: Diagnosis not present

## 2024-12-30 DIAGNOSIS — G8929 Other chronic pain: Secondary | ICD-10-CM | POA: Diagnosis not present

## 2024-12-30 MED ORDER — TRAMADOL HCL 50 MG PO TABS: 50.0000 mg | ORAL_TABLET | Freq: Three times a day (TID) | ORAL | 0 refills | Status: AC | PRN

## 2024-12-30 MED ORDER — GABAPENTIN 100 MG PO CAPS: 200.0000 mg | ORAL_CAPSULE | Freq: Two times a day (BID) | ORAL | 1 refills | Status: AC

## 2024-12-30 NOTE — Therapy (Signed)
 " OUTPATIENT PHYSICAL THERAPY THORACOLUMBAR TREATMENT   Patient Name: Amy Landry MRN: [id number] DOB:22-Jun-1986, 39 y.o., female Today's Date: 12/30/2024  END OF SESSION:  PT End of Session - 12/30/24 1018     Visit Number 2    Number of Visits 13    Date for Recertification  02/03/25    Authorization Type [employer]    PT Start Time 1018    PT Stop Time 1101    PT Time Calculation (min) 43 min    Activity Tolerance Patient tolerated treatment well    Behavior During Therapy WFL for tasks assessed/performed          Past Medical History:  Diagnosis Date   Abnormal Pap smear of cervix    LEEP    Anxiety    Ataxia telangiectasia (ATM) (HCC)    Carrier of genetic disorder    high risk cancer mutation gene - high risk for breast and pancreatic cancer   Depression    DVT (deep venous thrombosis) (HCC)    Eczema    History of kidney stones    IBD (inflammatory bowel disease)    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   Past Surgical History:  Procedure Laterality Date   AUGMENTATION MAMMAPLASTY Bilateral 07/2010   CERVICAL BIOPSY  W/ LOOP ELECTRODE EXCISION  2014   PLACEMENT OF BREAST IMPLANTS  07/2010   TUBAL LIGATION  08/14/2022   WISDOM TOOTH EXTRACTION     Patient Active Problem List   Diagnosis Date Noted   DVT of lower extremity (deep venous thrombosis) (HCC) 03/26/2024   Pulmonary embolism (HCC) 03/26/2024   Ulcerative proctitis with rectal bleeding (HCC) 03/11/2024   Rectal bleeding 10/31/2022   Monoallelic mutation of ATM gene 95/85/7977    PCP: [name], NP  REFERRING PROVIDER: Leonce Katz, DO  REFERRING DIAG: [phone] (ICD-10-CM) - Chronic right-sided low back pain with right-sided sciatica M51.362 (ICD-10-CM) - Degeneration of intervertebral disc of lumbar region with discogenic back pain and lower extremity pain  Rationale for Evaluation and Treatment: Rehabilitation  THERAPY DIAG:  Other low back pain  Muscle weakness  (generalized)  ONSET DATE: summer 2025  SUBJECTIVE:                                                                                                                                                                                           SUBJECTIVE STATEMENT: Patient reports her back was hurting Sunday, having a hard time extending upright in standing; states today it feels tweaky but no soreness or pain.   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 Was training for competition over the fall, but is now in off season and only training upper body. Symptoms have been pretty consistent, not worsening. Has tried chiropractor but has since deferred this. Reports past history of L5-S1 disc herniation in 2009, felt better with PT. States this current episode feels different. Has tried some core exercises with some improvement. Usually a belly or side sleeper but difficulty tolerating this right now. Reports good compliance w/ eliquis , recent DVT LLE. Denies N/T, does report sensation of weakness in R calf since these symptoms began. Denies bowel/bladder symptoms or saddle anesthesia. No L sided symptoms.    PERTINENT HISTORY:  hx DVT/PE, ataxia telangiectasia Recent LLE DVT on eliquis   PAIN:  Are you having pain: 6-7/10 Location/description: R low back, weakness in calf, pinching upper glute, radiates laterally Best-worst over past week: 4-7/10  - aggravating factors: posing (extension), transfers, prolonged sitting, lying flat, lower body dressing - Easing factors: sitting, lower body exercises   PRECAUTIONS: hx PE/DVT; on eliquis  Per 11/07/24 hem/onc note: Avoid lower body workouts for six weeks; upper body workouts allowed.  RED FLAGS: None   WEIGHT BEARING RESTRICTIONS: No  FALLS:  Has patient fallen in last 6 months? 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  DIAGNOSTIC FINDINGS:  Lumbar MRI 11/17/24: IMPRESSION: 1. Central and right paramedian disc extrusion at L5-S1 causing moderate right subarticular narrowing with likely right S1 nerve root impingement, and mild left subarticular narrowing. The foramina are patent. 2. Mild disc bulge at L2-3 with rightward protrusion and far-right annular tear without focal stenosis.  PATIENT SURVEYS:  PSFS:  - lying down on back 7  - lower body dressing 5  - sit to stand 4    - total 16 ; avg 5.33     COGNITION: Overall cognitive status: Within functional limits for tasks assessed   SENSATION/NEURO: Light touch intact BIL LE   No ataxia with gait     LUMBAR ROM:   AROM eval  Flexion Mid shin  Extension 100%  Right lateral flexion knee  Left lateral flexion knee  Right rotation 75% *  Left rotation 100%   (Blank rows = not tested) (Key: WFL = within functional limits not formally assessed, * = concordant pain, s = stiffness/stretching sensation, NT = not tested) Comment: rotational pain worse with sitting compared to standing  LOWER EXTREMITY ROM:      Right eval Left eval  Hip flexion    Hip extension    Hip internal rotation    Hip external rotation    Knee extension    Knee flexion    (Blank rows = not tested) (Key: WFL = within functional limits not formally assessed, * = concordant pain, s = stiffness/stretching sensation, NT = not tested)  Comments: pain with active hip flexion, less so passively. Pain with passive ER (RLE)  LOWER EXTREMITY MMT:    MMT Right eval Left eval  Hip flexion 3+ * 4+  Hip abduction (modified sitting)    Hip internal rotation 4+ 4+  Hip external rotation 4 * 4+  Knee  flexion 4 * 4+  Knee extension 4+ * 4+  Ankle dorsiflexion     (  Blank rows = not tested) (Key: WFL = within functional limits not formally assessed, * = concordant pain, s = stiffness/stretching sensation, NT = not tested)  Comments:    LUMBAR SPECIAL TESTS:   FABER: positive RLE  FADDIR: negative RLE  Relief with short axis distraction RLE  Concordant pain with AP hip mob RLE  FUNCTIONAL TESTS:  5xSTS: 13.02sec no UE support, mild pain, guarded posture, mild shift to L No symptoms with SLS  GAIT: Distance walked: within clinic Assistive device utilized: None Level of assistance: Complete Independence Comments: guarded posture, reduced trunk rotation and   TREATMENT:  [facility] Adult PT Treatment:                                                DATE: 12/30/2024 Neuromuscular re-ed: Hooklying hip add isometric --> created pinch in low back Bent knee fall out Heel slides + TA activation  Seated marching on red PB for TA activation --> too difficult Seated marching on table (better position) Isometric deadbug Therapeutic Activity: Prone on elbows --> did not like Supine marching (slow)  Breathing + arms --> W, reaching up to ceiling, placed on pubic/navel Hooklying diaphragmatic breathing with PF activation  Sitting on red PB for PF activation (better position)    [facility] Adult PT Treatment:                                                DATE: 12/23/24 Therapeutic Exercise: HEP practice reps, handout + education (with sciatic nerve glides, modified for RLE resting on ground, knee slightly flexed, focusing on PF/DF only)  Self Care: Education/discussion re: exam findings as they relate to symptom behavior, encouraging introductory walking program, activity modification as indicated, sleep positioning, relevant anatomy/physiology and rationale for interventions                                                                                                                                 PATIENT EDUCATION:  Education details: Updated HEP Person educated: Patient Education method: Explanation, Demonstration, Tactile cues, Verbal cues Education comprehension: verbalized understanding, returned demonstration, verbal cues required, tactile cues required, and needs further education    HOME EXERCISE PROGRAM: Access Code: [email or web address] URL: [email or web address] Date: 12/30/2024 Prepared by: Lamarr Price  Exercises - Seated Sciatic Tensioner  - 2-3 x daily - 1 sets - 8-10 reps - Sit to Stand  - 2-3 x daily - 1 sets - 8 reps - Seated Hip Adduction Isometrics with Ball  - 2-3 x daily - 1 sets - 10-15 reps - Supine Transversus Abdominis Bracing - Hands on Stomach  - 1 x daily - 7 x weekly - 1 sets -  10 reps - 5 sec hold - Diaphragmatic Breathing in Child's Pose with Pelvic Floor Relaxation  - 2 x daily - 7 x weekly - 1 sets - 1 reps - 30 sec - 1 min hold - Seated March  - 1 x daily - 7 x weekly - 3 sets - 10 reps  ASSESSMENT:  CLINICAL IMPRESSION: Noted rib flair with initial attempts to activate deep core muscles; cueing and postioning modifications improved postural tension. Patient had difficulty activating transverse abdominis without hip flexor compensation. Towel prop used to promote proprioceptive awareness with postural stability and neutral pelvis alignment in supine position; recommended patient utilize towel cue with upper body workouts for thoracic/ribcage stability.   EVAL: Patient is a pleasant 39 y.o. woman who was seen today for physical therapy evaluation and treatment for low back/LE pain. Start of PT delayed per hem/onc recommendations to avoid lower body workouts for 6 weeks (refer to 11/07/24 documentation in EPIC). Endorses difficulty w/ static standing, STS transfers (especially after prolonged sitting), lying flat, and lower body ADLs. Red flag questioning reassuring today. On exam she demonstrates concordant limitations in lumbar and R hip  mobility, RLE strength (limited by pain rather than weakness), transfer mechanics. 5xSTS outside of age norm (MCID of 2.3sec, age cohort norm 6.2+/-1.3sec per Easter et al 2007) and around cutoff score for fall risk (generally 12-14 sec). Some initial symptom irritability with trial of STS for HEP but improves w/ repetition, does well with nerve glides. No adverse events, reports baseline symptoms on departure. Recommend trial of skilled PT to address aforementioned deficits with aim of improving functional tolerance and reducing pain with typical activities. Pt departs today's session in no acute distress, all voiced concerns/questions addressed appropriately from PT perspective.     OBJECTIVE IMPAIRMENTS: decreased activity tolerance, decreased endurance, decreased mobility, difficulty walking, decreased ROM, decreased strength, impaired perceived functional ability, improper body mechanics, and pain.   ACTIVITY LIMITATIONS: bending, sitting, standing, squatting, sleeping, transfers, and locomotion level  PARTICIPATION LIMITATIONS: community activity and occupation  PERSONAL FACTORS: Time since onset of injury/illness/exacerbation and 1-2 comorbidities: hx DVT/PE, ataxia telangiectasia are also affecting patient's functional outcome.   REHAB POTENTIAL: Good  CLINICAL DECISION MAKING: Stable/uncomplicated  EVALUATION COMPLEXITY: Low   GOALS:  SHORT TERM GOALS: Target date: 01/13/2025  Pt will demonstrate appropriate understanding and performance of initially prescribed HEP in order to facilitate improved independence with management of symptoms.  Baseline: HEP established  Goal status: INITIAL   2. Pt will report at least 25% improvement in overall pain levels over past week in order to facilitate improved tolerance to typical daily activities.   Baseline: 4-7/10  Goal status: INITIAL    LONG TERM GOALS: Target date: 02/03/2025 Pt will improve avg score to at least 8 on PSFS in order  to demonstrate improved perception of functional status due to symptoms.  Baseline: 5.33 Goal status: INITIAL  2.  Pt will demonstrate symmetrical and painless lumbar rotation AROM in order to demonstrate improved tolerance to functional movement patterns.  Baseline: see ROM chart above Goal status: INITIAL  3.  Pt will demonstrate RLE MMT of at least 4+/5 throughout in tested groups n order to demonstrate improved strength for functional movements.  Baseline: see MMT chart above Goal status: INITIAL  4. Pt will perform 5xSTS in </= 8 sec in order to demonstrate reduced fall risk and improved functional independence. (MCID of 2.3sec)  Baseline: 13sec no UE support  Goal status: INITIAL   5. Pt will  report no more than 2 pt increase in pain with lower body ADLs in past week for improved tolerance to daily tasks.  Baseline: increased pain/difficulty w/ donning shoes and socks  Goal status: INITIAL  6. Pt will report at least 50% decrease in overall pain levels in past week in order to facilitate improved tolerance to basic ADLs/mobility.   Baseline: 4-7/10  Goal status: INITIAL    PLAN:  PT FREQUENCY: 1-2x/week  PT DURATION: 6 weeks  PLANNED INTERVENTIONS: 97164- PT Re-evaluation, 97750- Physical Performance Testing, 97110-Therapeutic exercises, 97530- Therapeutic activity, [id number]- Neuromuscular re-education, 97535- Self Care, [id number]- Manual therapy, [phone]- Gait training, [phone]- Aquatic Therapy, Patient/Family education, Balance training, Stair training, Taping, Joint mobilization, Spinal mobilization, Cryotherapy, and Moist heat.  PLAN FOR NEXT SESSION: Add bent knee fall out to HEP; progress TA activation & ribcage stability. Work on [employer] exercises as appropriate with emphasis on core/hip stability, STS blocked practice, walking program, nerve glides. Consider utilization of hip distraction and soft tissue work glutes/lumbar spine for pain modification if indicated. Symptom  modification strategies as indicated/appropriate. Mindful of recent DVT and PE hx, on eliquis    Lamarr Price, PTA 12/30/2024 12:11 PM  "

## 2025-01-02 ENCOUNTER — Ambulatory Visit

## 2025-01-02 DIAGNOSIS — G8929 Other chronic pain: Secondary | ICD-10-CM | POA: Diagnosis not present

## 2025-01-02 DIAGNOSIS — M5459 Other low back pain: Secondary | ICD-10-CM

## 2025-01-02 DIAGNOSIS — M6281 Muscle weakness (generalized): Secondary | ICD-10-CM

## 2025-01-02 NOTE — Therapy (Signed)
 " OUTPATIENT PHYSICAL THERAPY THORACOLUMBAR TREATMENT   Patient Name: Kirk Sampley MRN: [id number] DOB:05/29/1986, 39 y.o., female Today's Date: 01/02/2025  END OF SESSION:  PT End of Session - 01/02/25 0850     Visit Number 3    Number of Visits 13    Date for Recertification  02/03/25    Authorization Type [employer]    PT Start Time 0850    PT Stop Time 0928    PT Time Calculation (min) 38 min    Activity Tolerance Patient tolerated treatment well    Behavior During Therapy WFL for tasks assessed/performed          Past Medical History:  Diagnosis Date   Abnormal Pap smear of cervix    LEEP    Anxiety    Ataxia telangiectasia (ATM) (HCC)    Carrier of genetic disorder    high risk cancer mutation gene - high risk for breast and pancreatic cancer   Depression    DVT (deep venous thrombosis) (HCC)    Eczema    History of kidney stones    IBD (inflammatory bowel disease)    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   Past Surgical History:  Procedure Laterality Date   AUGMENTATION MAMMAPLASTY Bilateral 07/2010   CERVICAL BIOPSY  W/ LOOP ELECTRODE EXCISION  2014   PLACEMENT OF BREAST IMPLANTS  07/2010   TUBAL LIGATION  08/14/2022   WISDOM TOOTH EXTRACTION     Patient Active Problem List   Diagnosis Date Noted   DVT of lower extremity (deep venous thrombosis) (HCC) 03/26/2024   Pulmonary embolism (HCC) 03/26/2024   Ulcerative proctitis with rectal bleeding (HCC) 03/11/2024   Rectal bleeding 10/31/2022   Monoallelic mutation of ATM gene 95/85/7977    PCP: [name], NP  REFERRING PROVIDER: Leonce Katz, DO  REFERRING DIAG: [phone] (ICD-10-CM) - Chronic right-sided low back pain with right-sided sciatica M51.362 (ICD-10-CM) - Degeneration of intervertebral disc of lumbar region with discogenic back pain and lower extremity pain  Rationale for Evaluation and Treatment: Rehabilitation  THERAPY DIAG:  Other low back pain  Muscle weakness  (generalized)  ONSET DATE: summer 2025  SUBJECTIVE:                                                                                                                                                                                           SUBJECTIVE STATEMENT: Patient reports she was started on low dose of Gabapentin  and 2-weeks of Meloxicam and Tramadol  as needed. Patient states she has less intense pain when standing up; states her low abs were sore after last PT visit.    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 Was training for competition over the fall, but is now in off season and only training upper body. Symptoms have been pretty consistent, not worsening. Has tried chiropractor but has since deferred this. Reports past history of L5-S1 disc herniation in 2009, felt better with PT. States this current episode feels different. Has tried some core exercises with some improvement. Usually a belly or side sleeper but difficulty tolerating this right now. Reports good compliance w/ eliquis , recent DVT LLE. Denies N/T, does report sensation of weakness in R calf since these symptoms began. Denies bowel/bladder symptoms or saddle anesthesia. No L sided symptoms.    PERTINENT HISTORY:  hx DVT/PE, ataxia telangiectasia Recent LLE DVT on eliquis   PAIN:  Are you having pain: 3/10 Location/description: R low back, weakness in calf, pinching upper glute, radiates laterally Best-worst over past week: 4-7/10  - aggravating factors: posing (extension), transfers, prolonged sitting, lying flat, lower body dressing - Easing factors: sitting, lower body exercises   PRECAUTIONS: hx PE/DVT; on eliquis  Per 11/07/24 hem/onc note: Avoid lower body workouts for six weeks; upper body workouts allowed.  RED FLAGS: None   WEIGHT BEARING RESTRICTIONS: No  FALLS:  Has patient fallen in  last 6 months? 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  DIAGNOSTIC FINDINGS:  Lumbar MRI 11/17/24: IMPRESSION: 1. Central and right paramedian disc extrusion at L5-S1 causing moderate right subarticular narrowing with likely right S1 nerve root impingement, and mild left subarticular narrowing. The foramina are patent. 2. Mild disc bulge at L2-3 with rightward protrusion and far-right annular tear without focal stenosis.  PATIENT SURVEYS:  PSFS:  - lying down on back 7  - lower body dressing 5  - sit to stand 4    - total 16 ; avg 5.33     COGNITION: Overall cognitive status: Within functional limits for tasks assessed   SENSATION/NEURO: Light touch intact BIL LE   No ataxia with gait     LUMBAR ROM:   AROM eval  Flexion Mid shin  Extension 100%  Right lateral flexion knee  Left lateral flexion knee  Right rotation 75% *  Left rotation 100%   (Blank rows = not tested) (Key: WFL = within functional limits not formally assessed, * = concordant pain, s = stiffness/stretching sensation, NT = not tested) Comment: rotational pain worse with sitting compared to standing  LOWER EXTREMITY ROM:      Right eval Left eval  Hip flexion    Hip extension    Hip internal rotation    Hip external rotation    Knee extension    Knee flexion    (Blank rows = not tested) (Key: WFL = within functional limits not formally assessed, * = concordant pain, s = stiffness/stretching sensation, NT = not tested)  Comments: pain with active hip flexion, less so passively. Pain with passive ER (RLE)  LOWER EXTREMITY MMT:    MMT Right eval Left eval  Hip flexion 3+ * 4+  Hip abduction (modified sitting)    Hip  internal rotation 4+ 4+  Hip external rotation 4 * 4+  Knee flexion 4 * 4+  Knee extension 4+ * 4+  Ankle dorsiflexion     (Blank rows = not tested) (Key: WFL = within functional limits not formally assessed, * = concordant pain, s = stiffness/stretching sensation, NT = not tested)  Comments:    LUMBAR SPECIAL TESTS:   FABER: positive RLE  FADDIR: negative RLE  Relief with short axis distraction RLE  Concordant pain with AP hip mob RLE  FUNCTIONAL TESTS:  5xSTS: 13.02sec no UE support, mild pain, guarded posture, mild shift to L No symptoms with SLS  GAIT: Distance walked: within clinic Assistive device utilized: None Level of assistance: Complete Independence Comments: guarded posture, reduced trunk rotation and   TREATMENT:  [facility] Adult PT Treatment:                                                DATE: 01/02/2025 Therapeutic Exercise: Supine sciatic nerve glide Seated sciatic tensioner Neuromuscular re-ed: Supine: Heel slides with TA activation Bent knee fall out --> x5 unilateral, x5 alternating Modified unilateral 90/90 toe taps --> heel taps  90/90 with opp toe --> heel taps --> alternating heel taps Hooklying hip add isometric ball squeeze --> tactile cue to relax glutes Therapeutic Activity: Bridges + ball b/w knees --> small range (a little pinchy) Bridges + blue TB around thighs (felt better) Bridge + small ROM marching (trial --> difficult)    [facility] Adult PT Treatment:                                                DATE: 12/30/2024 Neuromuscular re-ed: Hooklying hip add isometric --> created pinch in low back Bent knee fall out Heel slides + TA activation  Seated marching on red PB for TA activation --> too difficult Seated marching on table (better position) Isometric deadbug Therapeutic Activity: Prone on elbows --> did not like Supine marching (slow)  Breathing + arms --> W, reaching up to ceiling, placed on pubic/navel Hooklying diaphragmatic  breathing with PF activation  Sitting on red PB for PF activation (better position)    [facility] Adult PT Treatment:                                                DATE: 12/23/24 Therapeutic Exercise: HEP practice reps, handout + education (with sciatic nerve glides, modified for RLE resting on ground, knee slightly flexed, focusing on PF/DF only)  Self Care: Education/discussion re: exam findings as they relate to symptom behavior, encouraging introductory walking program, activity modification as indicated, sleep positioning, relevant anatomy/physiology and rationale for interventions  PATIENT EDUCATION:  Education details: Updated HEP Person educated: Patient Education method: Explanation, Demonstration, Tactile cues, Verbal cues Education comprehension: verbalized understanding, returned demonstration, verbal cues required, tactile cues required, and needs further education    HOME EXERCISE PROGRAM: Access Code: [email or web address] URL: [email or web address] Date: 01/02/2025 Prepared by: Lamarr Price  Exercises - Seated Sciatic Tensioner  - 2-3 x daily - 1 sets - 8-10 reps - Sit to Stand  - 2-3 x daily - 1 sets - 8 reps - Seated Hip Adduction Isometrics with Ball  - 2-3 x daily - 1 sets - 10-15 reps - Supine Transversus Abdominis Bracing - Hands on Stomach  - 1 x daily - 7 x weekly - 1 sets - 10 reps - 5 sec hold - Diaphragmatic Breathing in Child's Pose with Pelvic Floor Relaxation  - 2 x daily - 7 x weekly - 1 sets - 1 reps - 30 sec - 1 min hold - Seated March  - 1 x daily - 7 x weekly - 3 sets - 10 reps - Bent Knee Fallouts  - 1 x daily - 7 x weekly - 3 sets - 10 reps - Hooklying Clamshell with Resistance  - 1 x daily - 7 x weekly - 3 sets - 10 reps - 5 sec hold - Supine Bridge with Resistance Band  - 1 x daily - 7 x weekly - 3 sets - 10  reps  ASSESSMENT:  CLINICAL IMPRESSION: Progressed core stability exercises and glute activation without aggravating symptoms. Better response to bridges with hip abduction stabilization vs adduction; variation added to HEP. Improved transverse abdominis activation noted,with patient requiring less frequent cueing. Cueing arm reach up promoted posterior ribcage positioning and self-corrected anterior rib flare compensation.   EVAL: Patient is a pleasant 39 y.o. woman who was seen today for physical therapy evaluation and treatment for low back/LE pain. Start of PT delayed per hem/onc recommendations to avoid lower body workouts for 6 weeks (refer to 11/07/24 documentation in [facility]). Endorses difficulty w/ static standing, STS transfers (especially after prolonged sitting), lying flat, and lower body ADLs. Red flag questioning reassuring today. On exam she demonstrates concordant limitations in lumbar and R hip mobility, RLE strength (limited by pain rather than weakness), transfer mechanics. 5xSTS outside of age norm (MCID of 2.3sec, age cohort norm 6.2+/-1.3sec per Easter et al 2007) and around cutoff score for fall risk (generally 12-14 sec). Some initial symptom irritability with trial of STS for HEP but improves w/ repetition, does well with nerve glides. No adverse events, reports baseline symptoms on departure. Recommend trial of skilled PT to address aforementioned deficits with aim of improving functional tolerance and reducing pain with typical activities. Pt departs today's session in no acute distress, all voiced concerns/questions addressed appropriately from PT perspective.     OBJECTIVE IMPAIRMENTS: decreased activity tolerance, decreased endurance, decreased mobility, difficulty walking, decreased ROM, decreased strength, impaired perceived functional ability, improper body mechanics, and pain.   ACTIVITY LIMITATIONS: bending, sitting, standing, squatting, sleeping, transfers, and  locomotion level  PARTICIPATION LIMITATIONS: community activity and occupation  PERSONAL FACTORS: Time since onset of injury/illness/exacerbation and 1-2 comorbidities: hx DVT/PE, ataxia telangiectasia are also affecting patient's functional outcome.   REHAB POTENTIAL: Good  CLINICAL DECISION MAKING: Stable/uncomplicated  EVALUATION COMPLEXITY: Low   GOALS:  SHORT TERM GOALS: Target date: 01/13/2025  Pt will demonstrate appropriate understanding and performance of initially prescribed HEP in order to facilitate improved independence with management of symptoms.  Baseline: HEP established  Goal status:  INITIAL   2. Pt will report at least 25% improvement in overall pain levels over past week in order to facilitate improved tolerance to typical daily activities.   Baseline: 4-7/10  Goal status: INITIAL    LONG TERM GOALS: Target date: 02/03/2025 Pt will improve avg score to at least 8 on PSFS in order to demonstrate improved perception of functional status due to symptoms.  Baseline: 5.33 Goal status: INITIAL  2.  Pt will demonstrate symmetrical and painless lumbar rotation AROM in order to demonstrate improved tolerance to functional movement patterns.  Baseline: see ROM chart above Goal status: INITIAL  3.  Pt will demonstrate RLE MMT of at least 4+/5 throughout in tested groups n order to demonstrate improved strength for functional movements.  Baseline: see MMT chart above Goal status: INITIAL  4. Pt will perform 5xSTS in </= 8 sec in order to demonstrate reduced fall risk and improved functional independence. (MCID of 2.3sec)  Baseline: 13sec no UE support  Goal status: INITIAL   5. Pt will report no more than 2 pt increase in pain with lower body ADLs in past week for improved tolerance to daily tasks.  Baseline: increased pain/difficulty w/ donning shoes and socks  Goal status: INITIAL  6. Pt will report at least 50% decrease in overall pain levels in past week in  order to facilitate improved tolerance to basic ADLs/mobility.   Baseline: 4-7/10  Goal status: INITIAL    PLAN:  PT FREQUENCY: 1-2x/week  PT DURATION: 6 weeks  PLANNED INTERVENTIONS: [id number]- PT Re-evaluation, 97750- Physical Performance Testing, 97110-Therapeutic exercises, 97530- Therapeutic activity, [id number]- Neuromuscular re-education, 97535- Self Care, [id number]- Manual therapy, [phone]- Gait training, [phone]- Aquatic Therapy, Patient/Family education, Balance training, Stair training, Taping, Joint mobilization, Spinal mobilization, Cryotherapy, and Moist heat.  PLAN FOR NEXT SESSION: Core/glute strengthening & thoracic stability; ab prep. Follow-up on new HEP.  Work on [employer] exercises as appropriate with emphasis on core/hip stability, STS blocked practice, walking program, nerve glides. Consider utilization of hip distraction and soft tissue work glutes/lumbar spine for pain modification if indicated. Symptom modification strategies as indicated/appropriate. Mindful of recent DVT and PE hx, on eliquis    Lamarr Price, PTA 01/02/2025 9:28 AM  "

## 2025-01-06 ENCOUNTER — Ambulatory Visit: Admitting: Physical Therapy

## 2025-01-06 ENCOUNTER — Encounter: Payer: Self-pay | Admitting: Physical Therapy

## 2025-01-06 DIAGNOSIS — M6281 Muscle weakness (generalized): Secondary | ICD-10-CM

## 2025-01-06 DIAGNOSIS — G8929 Other chronic pain: Secondary | ICD-10-CM | POA: Diagnosis not present

## 2025-01-06 DIAGNOSIS — M5459 Other low back pain: Secondary | ICD-10-CM

## 2025-01-06 NOTE — Therapy (Signed)
 " OUTPATIENT PHYSICAL THERAPY TREATMENT   Patient Name: Amy Landry MRN: [id number] DOB:Dec 15, 1986, 39 y.o., female Today's Date: 01/06/2025  END OF SESSION:  PT End of Session - 01/06/25 1148     Visit Number 4    Number of Visits 13    Date for Recertification  02/03/25    Authorization Type [employer]    PT Start Time 1149    PT Stop Time 1230    PT Time Calculation (min) 41 min           Past Medical History:  Diagnosis Date   Abnormal Pap smear of cervix    LEEP    Anxiety    Ataxia telangiectasia (ATM) (HCC)    Carrier of genetic disorder    high risk cancer mutation gene - high risk for breast and pancreatic cancer   Depression    DVT (deep venous thrombosis) (HCC)    Eczema    History of kidney stones    IBD (inflammatory bowel disease)    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   Past Surgical History:  Procedure Laterality Date   AUGMENTATION MAMMAPLASTY Bilateral 07/2010   CERVICAL BIOPSY  W/ LOOP ELECTRODE EXCISION  2014   PLACEMENT OF BREAST IMPLANTS  07/2010   TUBAL LIGATION  08/14/2022   WISDOM TOOTH EXTRACTION     Patient Active Problem List   Diagnosis Date Noted   DVT of lower extremity (deep venous thrombosis) (HCC) 03/26/2024   Pulmonary embolism (HCC) 03/26/2024   Ulcerative proctitis with rectal bleeding (HCC) 03/11/2024   Rectal bleeding 10/31/2022   Monoallelic mutation of ATM gene 95/85/7977    PCP: [name], NP  REFERRING PROVIDER: Leonce Katz, DO  REFERRING DIAG: [phone] (ICD-10-CM) - Chronic right-sided low back pain with right-sided sciatica M51.362 (ICD-10-CM) - Degeneration of intervertebral disc of lumbar region with discogenic back pain and lower extremity pain  Rationale for Evaluation and Treatment: Rehabilitation  THERAPY DIAG:  Other low back pain  Muscle weakness (generalized)  ONSET DATE: summer 2025  SUBJECTIVE:                                                                                                                                                                                            SUBJECTIVE STATEMENT: 01/06/2025: surprised at how good I'm feeling after a couple weeks. Woke up without pain yesterday for first time, hasn't had any LE symptoms since sunday. Did legs yesterday, light weight, no pain but a little sore today. A teeny bit of pain today. Felt good with last session's exercises. 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 Was training for competition over the fall, but is now in off season and only training upper body. Symptoms have been pretty consistent, not worsening. Has tried chiropractor but has since deferred this. Reports past history of L5-S1 disc herniation in 2009, felt better with PT. States this current episode feels different. Has tried some core exercises with some improvement. Usually a belly or side sleeper but difficulty tolerating this right now. Reports good compliance w/ eliquis , recent DVT LLE. Denies N/T, does report sensation of weakness in R calf since these symptoms began. Denies bowel/bladder symptoms or saddle anesthesia. No L sided symptoms.    PERTINENT HISTORY:  hx DVT/PE, ataxia telangiectasia Recent LLE DVT on eliquis   PAIN:  Are you having pain: 2/10  Per eval:  Location/description: R low back, weakness in calf, pinching upper glute, radiates laterally Best-worst over past week: 4-7/10  - aggravating factors: posing (extension), transfers, prolonged sitting, lying flat, lower body dressing - Easing factors: sitting, lower body exercises   PRECAUTIONS: hx PE/DVT; on eliquis  Per 11/07/24 hem/onc note: Avoid lower body workouts for six weeks; upper body workouts allowed.  RED FLAGS: None   WEIGHT BEARING RESTRICTIONS: No  FALLS:  Has patient fallen in last 6 months? 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  DIAGNOSTIC FINDINGS:  Lumbar MRI 11/17/24: IMPRESSION: 1. Central and right paramedian disc extrusion at L5-S1 causing moderate right subarticular narrowing with likely right S1 nerve root impingement, and mild left subarticular narrowing. The foramina are patent. 2. Mild disc bulge at L2-3 with rightward protrusion and far-right annular tear without focal stenosis.  PATIENT SURVEYS:  PSFS:  - lying down on back 7  - lower body dressing 5  - sit to stand 4    - total 16 ; avg 5.33     COGNITION: Overall cognitive status: Within functional limits for tasks assessed   SENSATION/NEURO: Light touch intact BIL LE   No ataxia with gait     LUMBAR ROM:   AROM eval  Flexion Mid shin  Extension 100%  Right lateral flexion knee  Left lateral flexion knee  Right rotation 75% *  Left rotation 100%   (Blank rows = not tested) (Key: WFL = within functional limits not formally assessed, * = concordant pain, s = stiffness/stretching sensation, NT = not tested) Comment: rotational pain worse with sitting compared to standing  LOWER EXTREMITY ROM:      Right eval Left eval  Hip flexion    Hip extension    Hip internal rotation    Hip external rotation    Knee extension    Knee flexion    (Blank rows = not tested) (Key: WFL = within functional limits not formally assessed, * = concordant pain, s = stiffness/stretching sensation, NT = not tested)  Comments: pain with active hip flexion, less so passively. Pain with passive ER (RLE)  LOWER EXTREMITY MMT:    MMT Right eval Left eval  Hip flexion 3+ * 4+  Hip abduction (modified sitting)    Hip internal rotation 4+ 4+  Hip  external rotation 4 * 4+  Knee flexion 4 * 4+  Knee extension 4+ * 4+  Ankle dorsiflexion     (Blank rows = not tested) (Key: WFL = within functional limits not formally assessed, * = concordant pain, s = stiffness/stretching sensation, NT = not tested)  Comments:    LUMBAR SPECIAL TESTS:   FABER: positive RLE  FADDIR: negative RLE  Relief with short axis distraction RLE  Concordant pain with AP hip mob RLE  FUNCTIONAL TESTS:  5xSTS: 13.02sec no UE support, mild pain, guarded posture, mild shift to L No symptoms with SLS  GAIT: Distance walked: within clinic Assistive device utilized: None Level of assistance: Complete Independence Comments: guarded posture, reduced trunk rotation and   TREATMENT:   [facility] Adult PT Treatment:                                                DATE: 01/06/25 Therapeutic Exercise: HEP discussion/education  Neuromuscular re-ed: Bent knee fallout + TA activation x10 SL hip abd hooklying RB 2x5 BIL 3-3-3 tempo  Bridge + PPT 2x10 cues for pelvic positioning  Seated hinge w/ swiss ball x12 Seated swiss ball hinge + gentle push down x10 cues for core activation and breath control  5# suitcase carry 1 lap BIL; strong band carry 5# 1 lap BIL  Triple flex/ext nerve glides BIL w/ UE support      [facility] Adult PT Treatment:                                                DATE: 01/02/2025 Therapeutic Exercise: Supine sciatic nerve glide Seated sciatic tensioner Neuromuscular re-ed: Supine: Heel slides with TA activation Bent knee fall out --> x5 unilateral, x5 alternating Modified unilateral 90/90 toe taps --> heel taps  90/90 with opp toe --> heel taps --> alternating heel taps Hooklying hip add isometric ball squeeze --> tactile cue to relax glutes Therapeutic Activity: Bridges + ball b/w knees --> small range (a little pinchy) Bridges + blue TB around thighs (felt better) Bridge + small ROM marching (trial --> difficult)    [facility] Adult PT  Treatment:                                                DATE: 12/30/2024 Neuromuscular re-ed: Hooklying hip add isometric --> created pinch in low back Bent knee fall out Heel slides + TA activation  Seated marching on red PB for TA activation --> too difficult Seated marching on table (better position) Isometric deadbug Therapeutic Activity: Prone on elbows --> did not like Supine marching (slow)  Breathing + arms --> W, reaching up to ceiling, placed on pubic/navel Hooklying diaphragmatic breathing with PF activation  Sitting on red PB for PF activation (better position)   PATIENT EDUCATION:  Education details: rationale for interventions, HEP  Person educated: Patient Education method: Explanation, Demonstration, Tactile cues, Verbal cues Education comprehension: verbalized understanding, returned demonstration, verbal cues required, tactile cues required, and needs further education     HOME EXERCISE PROGRAM: Access Code: [email or web address] URL: [email or web address] Date: 01/02/2025 Prepared by: Lamarr Price  Exercises - Seated  Sciatic Tensioner  - 2-3 x daily - 1 sets - 8-10 reps - Sit to Stand  - 2-3 x daily - 1 sets - 8 reps - Seated Hip Adduction Isometrics with Ball  - 2-3 x daily - 1 sets - 10-15 reps - Supine Transversus Abdominis Bracing - Hands on Stomach  - 1 x daily - 7 x weekly - 1 sets - 10 reps - 5 sec hold - Diaphragmatic Breathing in Child's Pose with Pelvic Floor Relaxation  - 2 x daily - 7 x weekly - 1 sets - 1 reps - 30 sec - 1 min hold - Seated March  - 1 x daily - 7 x weekly - 3 sets - 10 reps - Bent Knee Fallouts  - 1 x daily - 7 x weekly - 3 sets - 10 reps - Hooklying Clamshell with Resistance  - 1 x daily - 7 x weekly - 3 sets - 10 reps - 5 sec hold - Supine Bridge with Resistance Band  - 1 x daily - 7 x weekly - 3 sets - 10 reps  ASSESSMENT:  CLINICAL IMPRESSION: 01/06/2025: Pt arrives w/ 2/10 pain endorses steady progress w/ PT thus far.  Today we continue progressing lumbopelvic stability exercises w/ good tolerance. Bridge tolerance and ROM both improve w/ cues for PPT. Also integrating modified hinge and suitcase carry variations. Tolerates session well, tendency for reduced pinching with repetition of exercises. No adverse events, reports 1-2/10 on departure. Recommend continuing along current POC in order to address relevant deficits and improve functional tolerance. Pt departs today's session in no acute distress, all voiced questions/concerns addressed appropriately from PT perspective.     EVAL: Patient is a pleasant 39 y.o. woman who was seen today for physical therapy evaluation and treatment for low back/LE pain. Start of PT delayed per hem/onc recommendations to avoid lower body workouts for 6 weeks (refer to 11/07/24 documentation in EPIC). Endorses difficulty w/ static standing, STS transfers (especially after prolonged sitting), lying flat, and lower body ADLs. Red flag questioning reassuring today. On exam she demonstrates concordant limitations in lumbar and R hip mobility, RLE strength (limited by pain rather than weakness), transfer mechanics. 5xSTS outside of age norm (MCID of 2.3sec, age cohort norm 6.2+/-1.3sec per Easter et al 2007) and around cutoff score for fall risk (generally 12-14 sec). Some initial symptom irritability with trial of STS for HEP but improves w/ repetition, does well with nerve glides. No adverse events, reports baseline symptoms on departure. Recommend trial of skilled PT to address aforementioned deficits with aim of improving functional tolerance and reducing pain with typical activities. Pt departs today's session in no acute distress, all voiced concerns/questions addressed appropriately from PT perspective.     OBJECTIVE IMPAIRMENTS: decreased activity tolerance, decreased endurance, decreased mobility, difficulty walking, decreased ROM, decreased strength, impaired perceived functional  ability, improper body mechanics, and pain.   ACTIVITY LIMITATIONS: bending, sitting, standing, squatting, sleeping, transfers, and locomotion level  PARTICIPATION LIMITATIONS: community activity and occupation  PERSONAL FACTORS: Time since onset of injury/illness/exacerbation and 1-2 comorbidities: hx DVT/PE, ataxia telangiectasia are also affecting patient's functional outcome.   REHAB POTENTIAL: Good  CLINICAL DECISION MAKING: Stable/uncomplicated  EVALUATION COMPLEXITY: Low   GOALS:  SHORT TERM GOALS: Target date: 01/13/2025  Pt will demonstrate appropriate understanding and performance of initially prescribed HEP in order to facilitate improved independence with management of symptoms.  Baseline: HEP established  Goal status: INITIAL   2. Pt will report at least 25% improvement  in overall pain levels over past week in order to facilitate improved tolerance to typical daily activities.   Baseline: 4-7/10  Goal status: INITIAL    LONG TERM GOALS: Target date: 02/03/2025 Pt will improve avg score to at least 8 on PSFS in order to demonstrate improved perception of functional status due to symptoms.  Baseline: 5.33 Goal status: INITIAL  2.  Pt will demonstrate symmetrical and painless lumbar rotation AROM in order to demonstrate improved tolerance to functional movement patterns.  Baseline: see ROM chart above Goal status: INITIAL  3.  Pt will demonstrate RLE MMT of at least 4+/5 throughout in tested groups n order to demonstrate improved strength for functional movements.  Baseline: see MMT chart above Goal status: INITIAL  4. Pt will perform 5xSTS in </= 8 sec in order to demonstrate reduced fall risk and improved functional independence. (MCID of 2.3sec)  Baseline: 13sec no UE support  Goal status: INITIAL   5. Pt will report no more than 2 pt increase in pain with lower body ADLs in past week for improved tolerance to daily tasks.  Baseline: increased pain/difficulty  w/ donning shoes and socks  Goal status: INITIAL  6. Pt will report at least 50% decrease in overall pain levels in past week in order to facilitate improved tolerance to basic ADLs/mobility.   Baseline: 4-7/10  Goal status: INITIAL    PLAN:  PT FREQUENCY: 1-2x/week  PT DURATION: 6 weeks  PLANNED INTERVENTIONS: [id number]- PT Re-evaluation, 97750- Physical Performance Testing, 97110-Therapeutic exercises, 97530- Therapeutic activity, [id number]- Neuromuscular re-education, 97535- Self Care, [id number]- Manual therapy, [phone]- Gait training, [phone]- Aquatic Therapy, Patient/Family education, Balance training, Stair training, Taping, Joint mobilization, Spinal mobilization, Cryotherapy, and Moist heat.  PLAN FOR NEXT SESSION: Work on ROM/strength exercises as appropriate with emphasis on core/hip stability, beginning to progress more towards walking/gym program. Symptom modification strategies as indicated/appropriate. Mindful of recent DVT and PE hx, on eliquis    Alm DELENA Jenny PT, DPT 01/06/2025 12:50 PM  "

## 2025-01-08 ENCOUNTER — Ambulatory Visit

## 2025-01-16 ENCOUNTER — Ambulatory Visit

## 2025-01-22 ENCOUNTER — Ambulatory Visit: Payer: Self-pay

## 2025-01-23 ENCOUNTER — Ambulatory Visit: Attending: Adult Health Nurse Practitioner

## 2025-01-23 DIAGNOSIS — M5459 Other low back pain: Secondary | ICD-10-CM

## 2025-01-23 DIAGNOSIS — M6281 Muscle weakness (generalized): Secondary | ICD-10-CM

## 2025-01-23 NOTE — Therapy (Signed)
 " OUTPATIENT PHYSICAL THERAPY TREATMENT   Patient Name: Amy Landry MRN: [id number] DOB:December 20, 1985, 39 y.o., female Today's Date: 01/23/2025  END OF SESSION:  PT End of Session - 01/23/25 0937     Visit Number 5    Number of Visits 13    Date for Recertification  02/03/25    Authorization Type [employer]    PT Start Time [phone]   pt arrived late   PT Stop Time 1025    PT Time Calculation (min) 48 min    Activity Tolerance Patient tolerated treatment well    Behavior During Therapy WFL for tasks assessed/performed         Past Medical History:  Diagnosis Date   Abnormal Pap smear of cervix    LEEP    Anxiety    Ataxia telangiectasia (ATM) (HCC)    Carrier of genetic disorder    high risk cancer mutation gene - high risk for breast and pancreatic cancer   Depression    DVT (deep venous thrombosis) (HCC)    Eczema    History of kidney stones    IBD (inflammatory bowel disease)    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   Past Surgical History:  Procedure Laterality Date   AUGMENTATION MAMMAPLASTY Bilateral 07/2010   CERVICAL BIOPSY  W/ LOOP ELECTRODE EXCISION  2014   PLACEMENT OF BREAST IMPLANTS  07/2010   TUBAL LIGATION  08/14/2022   WISDOM TOOTH EXTRACTION     Patient Active Problem List   Diagnosis Date Noted   DVT of lower extremity (deep venous thrombosis) (HCC) 03/26/2024   Pulmonary embolism (HCC) 03/26/2024   Ulcerative proctitis with rectal bleeding (HCC) 03/11/2024   Rectal bleeding 10/31/2022   Monoallelic mutation of ATM gene 95/85/7977    PCP: [name], NP  REFERRING PROVIDER: Leonce Katz, DO  REFERRING DIAG: [phone] (ICD-10-CM) - Chronic right-sided low back pain with right-sided sciatica M51.362 (ICD-10-CM) - Degeneration of intervertebral disc of lumbar region with discogenic back pain and lower extremity pain  Rationale for Evaluation and Treatment: Rehabilitation  THERAPY DIAG:  Other low back pain  Muscle weakness  (generalized)  ONSET DATE: summer 2025  SUBJECTIVE:                                                                                                                                                                                           SUBJECTIVE STATEMENT: Patient reports her back is hurting due to going back to work and a fall on Wednesday on her Rt side from the ice. Patient states she has some pinching pain on Rt side of low back.    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 Was training for competition over the fall, but is now in off season and only training upper body. Symptoms have been pretty consistent, not worsening. Has tried chiropractor but has since deferred this. Reports past history of L5-S1 disc herniation in 2009, felt better with PT. States this current episode feels different. Has tried some core exercises with some improvement. Usually a belly or side sleeper but difficulty tolerating this right now. Reports good compliance w/ eliquis , recent DVT LLE. Denies N/T, does report sensation of weakness in R calf since these symptoms began. Denies bowel/bladder symptoms or saddle anesthesia. No L sided symptoms.    PERTINENT HISTORY:  hx DVT/PE, ataxia telangiectasia Recent LLE DVT on eliquis   PAIN:  Are you having pain: 3-4/10  Per eval:  Location/description: R low back, weakness in calf, pinching upper glute, radiates laterally Best-worst over past week: 4-7/10  - aggravating factors: posing (extension), transfers, prolonged sitting, lying flat, lower body dressing - Easing factors: sitting, lower body exercises   PRECAUTIONS: hx PE/DVT; on eliquis  Per 11/07/24 hem/onc note: Avoid lower body workouts for six weeks; upper body workouts allowed.  RED FLAGS: None   WEIGHT BEARING RESTRICTIONS: No  FALLS:  Has patient fallen in last 6 months?  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  DIAGNOSTIC FINDINGS:  Lumbar MRI 11/17/24: IMPRESSION: 1. Central and right paramedian disc extrusion at L5-S1 causing moderate right subarticular narrowing with likely right S1 nerve root impingement, and mild left subarticular narrowing. The foramina are patent. 2. Mild disc bulge at L2-3 with rightward protrusion and far-right annular tear without focal stenosis.  PATIENT SURVEYS:  PSFS:  - lying down on back 7  - lower body dressing 5  - sit to stand 4    - total 16 ; avg 5.33     COGNITION: Overall cognitive status: Within functional limits for tasks assessed   SENSATION/NEURO: Light touch intact BIL LE   No ataxia with gait     LUMBAR ROM:   AROM eval  Flexion Mid shin  Extension 100%  Right lateral flexion knee  Left lateral flexion knee  Right rotation 75% *  Left rotation 100%   (Blank rows = not tested) (Key: WFL = within functional limits not formally assessed, * = concordant pain, s = stiffness/stretching sensation, NT = not tested) Comment: rotational pain worse with sitting compared to standing  LOWER EXTREMITY ROM:      Right eval Left eval  Hip flexion    Hip extension    Hip internal rotation    Hip external rotation    Knee extension    Knee flexion    (Blank rows = not tested) (Key: WFL = within functional limits not formally assessed, * = concordant pain, s = stiffness/stretching sensation, NT = not tested)  Comments: pain with active hip flexion, less so passively. Pain with passive ER (RLE)  LOWER EXTREMITY MMT:    MMT Right eval Left eval  Hip flexion 3+ * 4+  Hip abduction (modified sitting)    Hip internal  rotation 4+ 4+  Hip external  rotation 4 * 4+  Knee flexion 4 * 4+  Knee extension 4+ * 4+  Ankle dorsiflexion     (Blank rows = not tested) (Key: WFL = within functional limits not formally assessed, * = concordant pain, s = stiffness/stretching sensation, NT = not tested)  Comments:    LUMBAR SPECIAL TESTS:   FABER: positive RLE  FADDIR: negative RLE  Relief with short axis distraction RLE  Concordant pain with AP hip mob RLE  FUNCTIONAL TESTS:  5xSTS: 13.02sec no UE support, mild pain, guarded posture, mild shift to L No symptoms with SLS  GAIT: Distance walked: within clinic Assistive device utilized: None Level of assistance: Complete Independence Comments: guarded posture, reduced trunk rotation and   TREATMENT:   [facility] Adult PT Treatment:                                                DATE: 01/23/2025 Therapeutic Exercise: Child's pose stretch Quadruped rocking  Modified pilates ab series: Single leg stretch Double leg stretch Scissors Double straight leg stretch Neuromuscular re-ed: Dead bug Modified dead bug with orange PB (feet on the mat) Isometric dead bug with orange PB Hooklying hip add isometric ball squeeze with exhale Tabletop legs + ball squeeze on exhale --> added head/shoulders curl up for ribcage placement Alternating leg extension from tabletop in partial upper body curl up Modified side plank 3 x 5 sec (bil) Therapeutic Activity: Quadruped:  Hip extension (toes on mat) + core stabilization Alternating arm raise Bird dog Bent knee fall out x10 each leg Modified single leg dead lift with opp heel braced against wall --> added 10#KB same/opp side  Added yoga block behind glute to cue hip hinge mechanics    [facility] Adult PT Treatment:                                                DATE: 01/06/25 Therapeutic Exercise: HEP discussion/education  Neuromuscular re-ed: Bent knee fallout + TA activation x10 SL hip abd hooklying RB 2x5 BIL 3-3-3 tempo   Bridge + PPT 2x10 cues for pelvic positioning  Seated hinge w/ swiss ball x12 Seated swiss ball hinge + gentle push down x10 cues for core activation and breath control  5# suitcase carry 1 lap BIL; strong band carry 5# 1 lap BIL  Triple flex/ext nerve glides BIL w/ UE support      [facility] Adult PT Treatment:                                                DATE: 01/02/2025 Therapeutic Exercise: Supine sciatic nerve glide Seated sciatic tensioner Neuromuscular re-ed: Supine: Heel slides with TA activation Bent knee fall out --> x5 unilateral, x5 alternating Modified unilateral 90/90 toe taps --> heel taps  90/90 with opp toe --> heel taps --> alternating heel taps Hooklying hip add isometric ball squeeze --> tactile cue to relax glutes Therapeutic Activity: Bridges + ball b/w knees --> small range (a little pinchy) Bridges + blue TB around thighs (felt better) Bridge + small ROM marching (trial --> difficult)  [facility] Adult PT Treatment:                                                DATE: 12/30/2024 Neuromuscular re-ed: Hooklying hip add isometric --> created pinch in low back Bent knee fall out Heel slides + TA activation  Seated marching on red PB for TA activation --> too difficult Seated marching on table (better position) Isometric deadbug Therapeutic Activity: Prone on elbows --> did not like Supine marching (slow)  Breathing + arms --> W, reaching up to ceiling, placed on pubic/navel Hooklying diaphragmatic breathing with PF activation  Sitting on red PB for PF activation (better position)   PATIENT EDUCATION:  Education details: Updated HEP  Person educated: Patient Education method: Explanation, Demonstration, Tactile cues, Verbal cues Education comprehension: verbalized understanding, returned demonstration, verbal cues required, tactile cues required, and needs further education     HOME EXERCISE PROGRAM: Access Code: [email or web address] URL:  [email or web address] Date: 01/23/2025 Prepared by: Lamarr Price  Exercises - Diaphragmatic Breathing in Child's Pose with Pelvic Floor Relaxation  - 2 x daily - 7 x weekly - 1 sets - 1 reps - 30 sec - 1 min hold - Bent Knee Fallouts  - 1 x daily - 7 x weekly - 3 sets - 10 reps - Hooklying Clamshell with Resistance  - 1 x daily - 7 x weekly - 3 sets - 10 reps - 5 sec hold - Supine Bridge with Resistance Band  - 1 x daily - 7 x weekly - 3 sets - 10 reps - Single Leg Stretch  - 1 x daily - 7 x weekly - 1 sets - 10 reps - Double Leg Stretch Diagonal  - 1 x daily - 7 x weekly - 3 sets - 10 reps - Side Plank on Knees  - 1 x daily - 7 x weekly - 1 sets - 3 reps - 5-10 sec hold - Modified Single-Leg Deadlift  - 1 x daily - 7 x weekly - 3 sets - 10 reps  ASSESSMENT:  CLINICAL IMPRESSION: Cueing upper body trunk flexion decreased anterior rib flare compensation and improved core activation and engagement; patient quick to fatigue with postural modifications. Moderate gluteal fatigue noted with single leg dead lift modification at wall and cueing for hip hinge mechanics; added resistance with no exacerbation of low back pain/discomfort.   EVAL: Patient is a pleasant 39 y.o. woman who was seen today for physical therapy evaluation and treatment for low back/LE pain. Start of PT delayed per hem/onc recommendations to avoid lower body workouts for 6 weeks (refer to 11/07/24 documentation in EPIC). Endorses difficulty w/ static standing, STS transfers (especially after prolonged sitting), lying flat, and lower body ADLs. Red flag questioning reassuring today. On exam she demonstrates concordant limitations in lumbar and R hip mobility, RLE strength (limited by pain rather than weakness), transfer mechanics. 5xSTS outside of age norm (MCID of 2.3sec, age cohort norm 6.2+/-1.3sec per Easter et al 2007) and around cutoff score for fall risk (generally 12-14 sec). Some initial symptom irritability  with trial of STS for HEP but improves w/ repetition, does well with nerve glides. No adverse events, reports baseline symptoms on departure. Recommend trial of skilled PT to address aforementioned deficits with aim of improving functional tolerance and reducing pain with typical activities. Pt departs today's session in no acute distress, all  voiced concerns/questions addressed appropriately from PT perspective.     OBJECTIVE IMPAIRMENTS: decreased activity tolerance, decreased endurance, decreased mobility, difficulty walking, decreased ROM, decreased strength, impaired perceived functional ability, improper body mechanics, and pain.   ACTIVITY LIMITATIONS: bending, sitting, standing, squatting, sleeping, transfers, and locomotion level  PARTICIPATION LIMITATIONS: community activity and occupation  PERSONAL FACTORS: Time since onset of injury/illness/exacerbation and 1-2 comorbidities: hx DVT/PE, ataxia telangiectasia are also affecting patient's functional outcome.   REHAB POTENTIAL: Good  CLINICAL DECISION MAKING: Stable/uncomplicated  EVALUATION COMPLEXITY: Low   GOALS:  SHORT TERM GOALS: Target date: 01/13/2025 (Updated 01/23/25)  Pt will demonstrate appropriate understanding and performance of initially prescribed HEP in order to facilitate improved independence with management of symptoms.  Baseline: HEP established  Goal status: MET  2. Pt will report at least 25% improvement in overall pain levels over past week in order to facilitate improved tolerance to typical daily activities.   Baseline: 4-7/10  01/23/25: 50% improvement  Goal status: MET   LONG TERM GOALS: Target date: 02/03/2025  Pt will improve avg score to at least 8 on PSFS in order to demonstrate improved perception of functional status due to symptoms.  Baseline: 5.33 Goal status: INITIAL  2.  Pt will demonstrate symmetrical and painless lumbar rotation AROM in order to demonstrate improved tolerance to  functional movement patterns.  Baseline: see ROM chart above Goal status: INITIAL  3.  Pt will demonstrate RLE MMT of at least 4+/5 throughout in tested groups n order to demonstrate improved strength for functional movements.  Baseline: see MMT chart above Goal status: INITIAL  4. Pt will perform 5xSTS in </= 8 sec in order to demonstrate reduced fall risk and improved functional independence. (MCID of 2.3sec)  Baseline: 13sec no UE support  Goal status: INITIAL   5. Pt will report no more than 2 pt increase in pain with lower body ADLs in past week for improved tolerance to daily tasks.  Baseline: increased pain/difficulty w/ donning shoes and socks  Goal status: INITIAL  6. Pt will report at least 50% decrease in overall pain levels in past week in order to facilitate improved tolerance to basic ADLs/mobility.   Baseline: 4-7/10  Goal status: INITIAL    PLAN:  PT FREQUENCY: 1-2x/week  PT DURATION: 6 weeks  PLANNED INTERVENTIONS: [id number]- PT Re-evaluation, 97750- Physical Performance Testing, 97110-Therapeutic exercises, 97530- Therapeutic activity, [id number]- Neuromuscular re-education, 97535- Self Care, [id number]- Manual therapy, [phone]- Gait training, [phone]- Aquatic Therapy, Patient/Family education, Balance training, Stair training, Taping, Joint mobilization, Spinal mobilization, Cryotherapy, and Moist heat.  PLAN FOR NEXT SESSION: Re-eval on 2/13 visit. Work on [employer] exercises as appropriate with emphasis on core/hip stability, beginning to progress more towards walking/gym program. Symptom modification strategies as indicated/appropriate. Mindful of recent DVT and PE hx, on eliquis    Lamarr Price, PTA 01/23/2025 10:43 AM  "

## 2025-01-28 ENCOUNTER — Ambulatory Visit

## 2025-01-30 ENCOUNTER — Ambulatory Visit: Admitting: Physical Therapy

## 2025-02-04 ENCOUNTER — Ambulatory Visit

## 2025-02-06 ENCOUNTER — Ambulatory Visit: Admitting: Physical Therapy

## 2025-03-12 ENCOUNTER — Ambulatory Visit: Admitting: Internal Medicine

## 2025-06-05 ENCOUNTER — Inpatient Hospital Stay: Attending: Hematology and Oncology | Admitting: Hematology and Oncology

## 2025-09-02 ENCOUNTER — Ambulatory Visit: Admitting: Hematology and Oncology
